# Patient Record
Sex: Male | Born: 1935 | Race: White | Hispanic: No | Marital: Married | Smoking: Former smoker
Health system: Southern US, Community
[De-identification: ages and names within clinical notes are randomized; demographics above are authoritative.]

## PROBLEM LIST (undated history)

## (undated) DIAGNOSIS — I10 Essential (primary) hypertension: Secondary | ICD-10-CM

## (undated) DIAGNOSIS — N289 Disorder of kidney and ureter, unspecified: Secondary | ICD-10-CM

## (undated) DIAGNOSIS — C61 Malignant neoplasm of prostate: Secondary | ICD-10-CM

---

## 2011-08-18 DIAGNOSIS — Z87891 Personal history of nicotine dependence: Secondary | ICD-10-CM | POA: Insufficient documentation

## 2011-08-18 DIAGNOSIS — R339 Retention of urine, unspecified: Secondary | ICD-10-CM | POA: Insufficient documentation

## 2011-08-18 DIAGNOSIS — I10 Essential (primary) hypertension: Secondary | ICD-10-CM | POA: Insufficient documentation

## 2011-08-19 ENCOUNTER — Encounter (HOSPITAL_COMMUNITY): Payer: Self-pay | Admitting: Emergency Medicine

## 2011-08-19 ENCOUNTER — Emergency Department (HOSPITAL_COMMUNITY)
Admission: EM | Admit: 2011-08-19 | Discharge: 2011-08-19 | Disposition: A | Discharge status: Home / Self Care | Payer: Medicare Other | Attending: Emergency Medicine | Admitting: Emergency Medicine

## 2011-08-19 DIAGNOSIS — R339 Retention of urine, unspecified: Secondary | ICD-10-CM

## 2011-08-19 HISTORY — DX: Essential (primary) hypertension: I10

## 2011-08-19 HISTORY — DX: Disorder of kidney and ureter, unspecified: N28.9

## 2011-08-19 LAB — URINALYSIS, MICROSCOPIC ONLY
Bilirubin Urine: NEGATIVE
Glucose, UA: NEGATIVE mg/dL
Ketones, ur: NEGATIVE mg/dL
Nitrite: NEGATIVE
Protein, ur: NEGATIVE mg/dL
Specific Gravity, Urine: 1.015 (ref 1.005–1.030)
Urobilinogen, UA: 0.2 mg/dL (ref 0.0–1.0)
pH: 5.5 (ref 5.0–8.0)

## 2011-08-19 NOTE — ED Notes (Signed)
Pt w/ foley cath off and on since Father's Day. Removed by Dr. Margarita Grizzle yesterday at noon pt unable to void since.

## 2011-08-19 NOTE — ED Provider Notes (Signed)
History     CSN: [id number]  Arrival date & time 08/18/11  2325   First MD Initiated Contact with Patient 08/19/11 0150      Chief Complaint  Patient presents with  . Urinary Retention    cath removed noon 08/18/11, has not voided since    (Consider location/radiation/quality/duration/timing/severity/associated sxs/prior treatment) HPI Hx per PT. abd pressure and unable to urinate since this afternoon. About weeks ago at [facility] had foley placed for new onset urinary retention. Saw DR Margarita Grizzle in the clinic yest afternoon and had foley removed. Had some small amnt of urination atthat time and then again developed retention. No new meds. no F/C. No n/v. progresssively worsening and on arrival symptoms severe.  Past Medical History  Diagnosis Date  . Renal disorder     catheter since 06/26/11  . Hypertension     History reviewed. No pertinent past surgical history.  No family history on file.  History  Substance Use Topics  . Smoking status: Former Games developer  . Smokeless tobacco: Never Used  . Alcohol Use: No      Review of Systems  Constitutional: Negative for fever and chills.  HENT: Negative for neck pain and neck stiffness.   Eyes: Negative for pain.  Respiratory: Negative for shortness of breath.   Cardiovascular: Negative for chest pain.  Gastrointestinal: Negative for abdominal pain.  Genitourinary: Negative for dysuria, hematuria, scrotal swelling, penile pain and testicular pain.  Musculoskeletal: Negative for back pain.  Skin: Negative for rash.  Neurological: Negative for headaches.  All other systems reviewed and are negative.    Allergies  Ampicillin; Erythromycin; and Other  Home Medications   Current Outpatient Rx  Name Route Sig Dispense Refill  . FINASTERIDE 5 MG PO TABS Oral Take 5 mg by mouth daily.    Marland Kitchen LISINOPRIL 20 MG PO TABS Oral Take 20 mg by mouth daily.    Marland Kitchen TAMSULOSIN HCL 0.4 MG PO CAPS Oral Take 0.4 mg by mouth daily.      BP  170/79  Pulse 90  Temp 98.5 F (36.9 C) (Oral)  SpO2 100%  Physical Exam  Constitutional: He is oriented to person, place, and time. He appears well-developed and well-nourished.  HENT:  Head: Normocephalic and atraumatic.  Eyes: Conjunctivae and EOM are normal. Pupils are equal, round, and reactive to light.  Neck: Trachea normal. Neck supple. No thyromegaly present.  Cardiovascular: Normal rate, regular rhythm, S1 normal, S2 normal and normal pulses.     No systolic murmur is present   No diastolic murmur is present  Pulses:      Radial pulses are 2+ on the right side, and 2+ on the left side.  Pulmonary/Chest: Effort normal and breath sounds normal. He has no wheezes. He has no rhonchi. He has no rales. He exhibits no tenderness.  Abdominal: Soft. Normal appearance and bowel sounds are normal. There is no CVA tenderness and negative Murphy's sign.       Suprapubic tenderness and distention. No tenderness or distention o/w  Musculoskeletal:       BLE:s Calves nontender, no cords or erythema, negative Homans sign  Neurological: He is alert and oriented to person, place, and time. He has normal strength. No cranial nerve deficit or sensory deficit. GCS eye subscore is 4. GCS verbal subscore is 5. GCS motor subscore is 6.  Skin: Skin is warm and dry. No rash noted. He is not diaphoretic.  Psychiatric: His speech is normal.  Cooperative and appropriate    ED Course  Procedures (including critical care time)  Foley placed and clear UOP 500cc at 0210 and still flowing.   UA reviewed.   Plan foley leg bag and f/u urology  Results for orders placed during the hospital encounter of 08/19/11  URINALYSIS, WITH MICROSCOPIC      Component Value Range   Color, Urine YELLOW  YELLOW   APPearance CLEAR  CLEAR   Specific Gravity, Urine 1.015  1.005 - 1.030   pH 5.5  5.0 - 8.0   Glucose, UA NEGATIVE  NEGATIVE mg/dL   Hgb urine dipstick SMALL (*) NEGATIVE   Bilirubin Urine NEGATIVE   NEGATIVE   Ketones, ur NEGATIVE  NEGATIVE mg/dL   Protein, ur NEGATIVE  NEGATIVE mg/dL   Urobilinogen, UA 0.2  0.0 - 1.0 mg/dL   Nitrite NEGATIVE  NEGATIVE   Leukocytes, UA SMALL (*) NEGATIVE   WBC, UA 7-10  <3 WBC/hpf   RBC / HPF 3-6  <3 RBC/hpf   UA reviewed - PT on Cipro per Urologist - U Cx pending.   Repeat exam ABd s/nt/nd. Stable for d/c home.   MDM   nuring notes reviewed. Vs reviewed. Old records reviewed. Foley and UA and U cx pending.        Sunnie Nielsen, MD 08/19/11 [phone]

## 2011-08-22 LAB — URINE CULTURE: Colony Count: >=100000

## 2011-08-23 NOTE — ED Notes (Addendum)
+   urine Chart sent to EDP office for review. 

## 2011-08-25 NOTE — ED Notes (Signed)
Chart returned from EDP office . Rx for Macrobid 100 mg tab #14 one tab po BID x 7 days written by Jaynie Crumble needs to be called to pharmacy .

## 2011-08-25 NOTE — ED Notes (Signed)
Patient treated with  cipro by PCP.

## 2014-02-21 ENCOUNTER — Other Ambulatory Visit (HOSPITAL_COMMUNITY): Payer: Self-pay | Admitting: Urology

## 2014-02-21 DIAGNOSIS — C61 Malignant neoplasm of prostate: Secondary | ICD-10-CM

## 2014-03-05 ENCOUNTER — Encounter (HOSPITAL_COMMUNITY)
Admission: RE | Admit: 2014-03-05 | Discharge: 2014-03-05 | Disposition: A | Discharge status: Home / Self Care | Payer: Medicare Other | Source: Ambulatory Visit | Attending: Urology | Admitting: Urology

## 2014-03-05 DIAGNOSIS — C61 Malignant neoplasm of prostate: Secondary | ICD-10-CM | POA: Diagnosis present

## 2014-03-05 MED ORDER — TECHNETIUM TC 99M MEDRONATE IV KIT
26.2000 | PACK | Freq: Once | INTRAVENOUS | Status: AC | PRN
Start: 2014-03-05 — End: 2014-03-05
  Administered 2014-03-05: 26.2 via INTRAVENOUS

## 2014-05-30 ENCOUNTER — Other Ambulatory Visit: Payer: Self-pay | Admitting: Urology

## 2014-05-30 DIAGNOSIS — C61 Malignant neoplasm of prostate: Secondary | ICD-10-CM

## 2014-05-30 DIAGNOSIS — Z79899 Other long term (current) drug therapy: Secondary | ICD-10-CM

## 2014-06-10 ENCOUNTER — Ambulatory Visit
Admission: RE | Admit: 2014-06-10 | Discharge: 2014-06-10 | Disposition: A | Discharge status: Home / Self Care | Payer: Medicare Other | Source: Ambulatory Visit | Attending: Urology | Admitting: Urology

## 2014-06-10 DIAGNOSIS — C61 Malignant neoplasm of prostate: Secondary | ICD-10-CM

## 2014-06-10 DIAGNOSIS — Z79899 Other long term (current) drug therapy: Secondary | ICD-10-CM

## 2015-10-27 ENCOUNTER — Other Ambulatory Visit: Payer: Self-pay | Admitting: Urology

## 2015-10-27 DIAGNOSIS — C61 Malignant neoplasm of prostate: Secondary | ICD-10-CM

## 2015-11-05 ENCOUNTER — Encounter (HOSPITAL_COMMUNITY)
Admission: RE | Admit: 2015-11-05 | Discharge: 2015-11-05 | Disposition: A | Discharge status: Home / Self Care | Payer: Medicare Other | Source: Ambulatory Visit | Attending: Urology | Admitting: Urology

## 2015-11-05 DIAGNOSIS — C61 Malignant neoplasm of prostate: Secondary | ICD-10-CM | POA: Diagnosis present

## 2015-11-05 IMAGING — NM NM BONE WHOLE BODY
2 series · 2 of 2 positions shown · non-contrast
Comparison: Nuclear medicine bone scan [DATE]. CT abdomen and
pelvis [DATE].

CLINICAL DATA: History of prostate cancer. Elevated PSA. No current
osseous complaints.

EXAM:
NUCLEAR MEDICINE WHOLE BODY BONE SCAN
TECHNIQUE: Whole body anterior and posterior images were obtained approximately
3 hours after intravenous injection of radiopharmaceutical.
RADIOPHARMACEUTICALS:  21.4 mCi [HP] MDP IV

[Series 1: whole body · 2.66mm/px · 1 of 1 slices shown (1 of 2)]
[im 1/1]
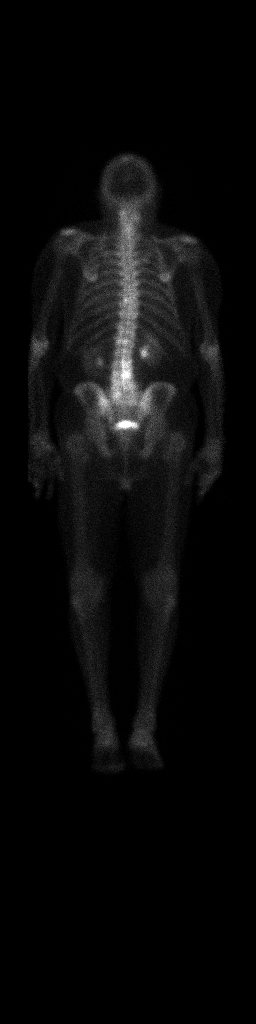

[Series 1: whole body · 2.66mm/px · 1 of 1 slices shown (2 of 2)]
[im 1/1]
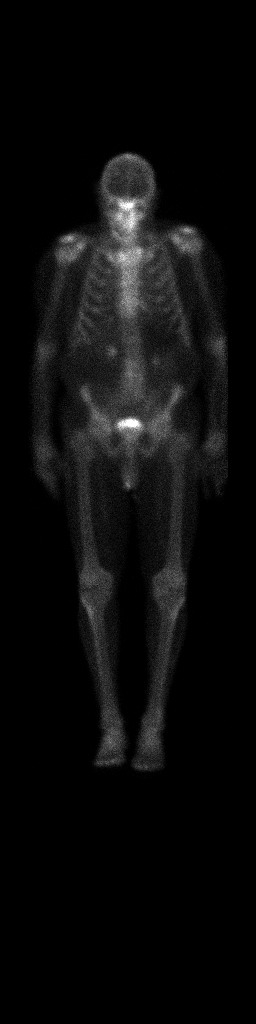

[2 of 2 positions shown; findings below may reference images not displayed]

FINDINGS: Normal radiotracer uptake by the kidneys with excreted tracer in the
bladder. Focally increased tracer uptake in the lower cervical spine
on the prior bone scan is less conspicuous on the current
examination. A punctate focus of slightly increased uptake in the
thoracic spine just left of midline at approximately T8 is
unchanged. Mildly increased uptake to the right of midline in the
mid to lower lumbar spine is unchanged as well. Asymmetric uptake
left of midline at the lumbosacral junction is unchanged. No new
foci of abnormal tracer uptake are seen.
IMPRESSION: 1. Similar appearance of mildly increased uptake scattered
throughout the spine as above, likely degenerative.
2. No new abnormality or definite evidence of osseous metastatic
disease.

## 2015-11-05 MED ORDER — TECHNETIUM TC 99M MEDRONATE IV KIT
21.4000 | PACK | Freq: Once | INTRAVENOUS | Status: AC | PRN
  Administered 2015-11-05: 21.4 via INTRAVENOUS

## 2017-11-07 ENCOUNTER — Other Ambulatory Visit: Payer: Self-pay | Admitting: Urology

## 2017-11-07 DIAGNOSIS — Z192 Hormone resistant malignancy status: Principal | ICD-10-CM

## 2017-11-07 DIAGNOSIS — C61 Malignant neoplasm of prostate: Secondary | ICD-10-CM

## 2017-11-22 ENCOUNTER — Ambulatory Visit (HOSPITAL_COMMUNITY)
Admission: RE | Admit: 2017-11-22 | Discharge: 2017-11-22 | Disposition: A | Discharge status: Home / Self Care | Payer: Medicare Other | Source: Ambulatory Visit | Attending: Urology | Admitting: Urology

## 2017-11-22 DIAGNOSIS — C61 Malignant neoplasm of prostate: Secondary | ICD-10-CM | POA: Diagnosis not present

## 2017-11-22 DIAGNOSIS — Z192 Hormone resistant malignancy status: Secondary | ICD-10-CM | POA: Insufficient documentation

## 2017-11-22 MED ORDER — TECHNETIUM TC 99M MEDRONATE IV KIT
21.8000 | PACK | Freq: Once | INTRAVENOUS | Status: AC | PRN
  Administered 2017-11-22: 21.8 via INTRAVENOUS

## 2018-08-31 ENCOUNTER — Other Ambulatory Visit: Payer: Self-pay | Admitting: Urology

## 2018-08-31 ENCOUNTER — Other Ambulatory Visit (HOSPITAL_COMMUNITY): Payer: Self-pay | Admitting: Urology

## 2018-08-31 DIAGNOSIS — C61 Malignant neoplasm of prostate: Secondary | ICD-10-CM

## 2018-09-05 ENCOUNTER — Ambulatory Visit (HOSPITAL_COMMUNITY): Admission: RE | Admit: 2018-09-05 | Payer: Medicare Other | Source: Ambulatory Visit

## 2018-09-05 ENCOUNTER — Other Ambulatory Visit (HOSPITAL_COMMUNITY): Payer: Self-pay | Admitting: Urology

## 2018-09-05 ENCOUNTER — Encounter (HOSPITAL_COMMUNITY): Payer: Self-pay

## 2018-09-05 DIAGNOSIS — C775 Secondary and unspecified malignant neoplasm of intrapelvic lymph nodes: Secondary | ICD-10-CM

## 2018-09-13 ENCOUNTER — Encounter (HOSPITAL_COMMUNITY)
Admission: RE | Admit: 2018-09-13 | Discharge: 2018-09-13 | Disposition: A | Discharge status: Home / Self Care | Payer: Medicare Other | Source: Ambulatory Visit | Attending: Urology | Admitting: Urology

## 2018-09-13 ENCOUNTER — Other Ambulatory Visit: Payer: Self-pay

## 2018-09-13 DIAGNOSIS — C775 Secondary and unspecified malignant neoplasm of intrapelvic lymph nodes: Secondary | ICD-10-CM | POA: Diagnosis not present

## 2018-09-13 IMAGING — NM NM BONE WHOLE BODY
2 series · 2 of 2 positions shown · non-contrast
Comparison: [DATE]

Correlation: CT chest abdomen pelvis [DATE]

CLINICAL DATA: Metastatic prostate cancer

EXAM:
NUCLEAR MEDICINE WHOLE BODY BONE SCAN
TECHNIQUE: Whole body anterior and posterior images were obtained approximately
3 hours after intravenous injection of radiopharmaceutical.
RADIOPHARMACEUTICALS:  19.6 mCi [XA] MDP IV

[Series 1: whole body · 2.66mm/px · 1 of 1 slices shown (1 of 2)]
[im 1/1]
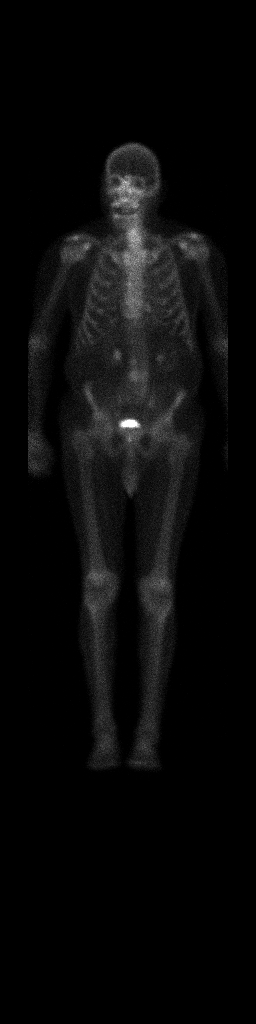

[Series 1: whole body · 2.66mm/px · 1 of 1 slices shown (2 of 2)]
[im 1/1]
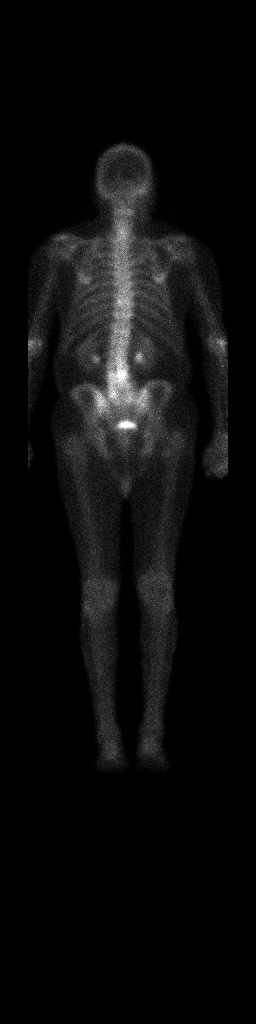

[2 of 2 positions shown; findings below may reference images not displayed]

FINDINGS: Uptake at the shoulders, typically degenerative.

Mild biconvex thoracolumbar scoliosis with uptake at the concave
RIGHT lateral aspect of the lower lumbar spine and at the LEFT mid
to lower thoracic spine.

The sites are unchanged since the previous exam, likely degenerative
and corresponding to degenerative changes identified on CT.

No additional worrisome sites of abnormal osseous tracer
accumulation are identified to suggest osseous metastatic disease.

Expected urinary tract and soft tissue distribution of tracer.
IMPRESSION: No scintigraphic evidence of osseous metastatic disease.

## 2018-09-13 MED ORDER — TECHNETIUM TC 99M MEDRONATE IV KIT
19.6000 | PACK | Freq: Once | INTRAVENOUS | Status: AC
  Administered 2018-09-13: 13:00:00 19.6 via INTRAVENOUS

## 2019-03-21 ENCOUNTER — Ambulatory Visit: Payer: Medicare Other | Attending: Internal Medicine

## 2019-03-21 DIAGNOSIS — Z23 Encounter for immunization: Secondary | ICD-10-CM

## 2019-03-21 NOTE — Progress Notes (Signed)
   Covid-19 Vaccination Clinic  Name:  Martin Foster    MRN: [id number] DOB: 10-Dec-1925  03/21/2019  [name] was observed post Covid-19 immunization for 15 minutes without incident. He was provided with Vaccine Information Sheet and instruction to access the V-Safe system.   [name] was instructed to call 911 with any severe reactions post vaccine: Marland Kitchen Difficulty breathing  . Swelling of face and throat  . A fast heartbeat  . A bad rash all over body  . Dizziness and weakness   Immunizations Administered    Name Date Dose VIS Date Route   Pfizer COVID-19 Vaccine 03/21/2019  3:40 PM 0.3 mL 12/21/2018 Intramuscular   Manufacturer: [place]   Lot: [id number]   [place]: [id number]

## 2019-04-15 ENCOUNTER — Ambulatory Visit: Payer: Medicare Other | Attending: Internal Medicine

## 2019-04-15 DIAGNOSIS — Z23 Encounter for immunization: Secondary | ICD-10-CM

## 2019-04-15 NOTE — Progress Notes (Signed)
   Covid-19 Vaccination Clinic  Name:  Martin Foster    MRN: [id number] DOB: June 17, 1925  04/15/2019  Mr. [name] was observed post Covid-19 immunization for 15 minutes without incident. He was provided with Vaccine Information Sheet and instruction to access the V-Safe system.   Mr. [name] was instructed to call 911 with any severe reactions post vaccine: Marland Kitchen Difficulty breathing  . Swelling of face and throat  . A fast heartbeat  . A bad rash all over body  . Dizziness and weakness   Immunizations Administered    Name Date Dose VIS Date Route   Pfizer COVID-19 Vaccine 04/15/2019  4:13 PM 0.3 mL 12/21/2018 Intramuscular   Manufacturer: [employer]   Lot: [id number]   [place]: [id number]

## 2019-11-11 DIAGNOSIS — K3580 Unspecified acute appendicitis: Secondary | ICD-10-CM | POA: Insufficient documentation

## 2019-11-11 DIAGNOSIS — K6289 Other specified diseases of anus and rectum: Secondary | ICD-10-CM | POA: Insufficient documentation

## 2019-11-11 DIAGNOSIS — I700 Atherosclerosis of aorta: Secondary | ICD-10-CM | POA: Insufficient documentation

## 2019-11-11 HISTORY — PX: APPENDECTOMY: SHX54

## 2019-11-13 DIAGNOSIS — I10 Essential (primary) hypertension: Secondary | ICD-10-CM | POA: Insufficient documentation

## 2020-01-09 DIAGNOSIS — C20 Malignant neoplasm of rectum: Secondary | ICD-10-CM

## 2020-01-09 HISTORY — DX: Malignant neoplasm of rectum: C20

## 2020-01-14 DIAGNOSIS — D128 Benign neoplasm of rectum: Secondary | ICD-10-CM | POA: Diagnosis not present

## 2020-02-10 DIAGNOSIS — R339 Retention of urine, unspecified: Secondary | ICD-10-CM | POA: Diagnosis not present

## 2020-02-10 DIAGNOSIS — Z880 Allergy status to penicillin: Secondary | ICD-10-CM | POA: Diagnosis not present

## 2020-02-10 DIAGNOSIS — Z79899 Other long term (current) drug therapy: Secondary | ICD-10-CM | POA: Diagnosis not present

## 2020-02-10 DIAGNOSIS — N390 Urinary tract infection, site not specified: Secondary | ICD-10-CM | POA: Diagnosis not present

## 2020-02-10 DIAGNOSIS — T83038A Leakage of other indwelling urethral catheter, initial encounter: Secondary | ICD-10-CM | POA: Diagnosis not present

## 2020-02-10 DIAGNOSIS — Z881 Allergy status to other antibiotic agents status: Secondary | ICD-10-CM | POA: Diagnosis not present

## 2020-02-10 DIAGNOSIS — I10 Essential (primary) hypertension: Secondary | ICD-10-CM | POA: Diagnosis not present

## 2020-02-18 DIAGNOSIS — C20 Malignant neoplasm of rectum: Secondary | ICD-10-CM | POA: Diagnosis not present

## 2020-02-19 ENCOUNTER — Other Ambulatory Visit: Payer: Self-pay | Admitting: General Surgery

## 2020-02-19 DIAGNOSIS — C20 Malignant neoplasm of rectum: Secondary | ICD-10-CM

## 2020-02-21 ENCOUNTER — Other Ambulatory Visit: Payer: Self-pay

## 2020-02-21 ENCOUNTER — Ambulatory Visit
Admission: RE | Admit: 2020-02-21 | Discharge: 2020-02-21 | Disposition: A | Discharge status: Home / Self Care | Payer: Medicare Other | Source: Ambulatory Visit | Attending: General Surgery | Admitting: General Surgery

## 2020-02-21 DIAGNOSIS — C20 Malignant neoplasm of rectum: Secondary | ICD-10-CM | POA: Diagnosis not present

## 2020-02-21 DIAGNOSIS — K6289 Other specified diseases of anus and rectum: Secondary | ICD-10-CM | POA: Diagnosis not present

## 2020-02-21 DIAGNOSIS — D492 Neoplasm of unspecified behavior of bone, soft tissue, and skin: Secondary | ICD-10-CM | POA: Diagnosis not present

## 2020-02-21 IMAGING — MR MR PELVIS W/O CM
6 of 7 series · 37 of 48 positions shown · non-contrast
Comparison: None

CLINICAL DATA: Rectal cancer, by report the patient has an
abnormality that was identified on a prior CT evaluation for which
no images are available.

EXAM:
MRI PELVIS WITHOUT CONTRAST
TECHNIQUE: Multiplanar multisequence MR imaging of the pelvis was performed. No
intravenous contrast was administered. Small amount of US gel was
administered per rectum to optimize tumor evaluation.

[Series 2: t2_tse_sag · sagittal · 3.0mm · 0.47mm/px · 5 of 37 slices shown]
[im 1/37]
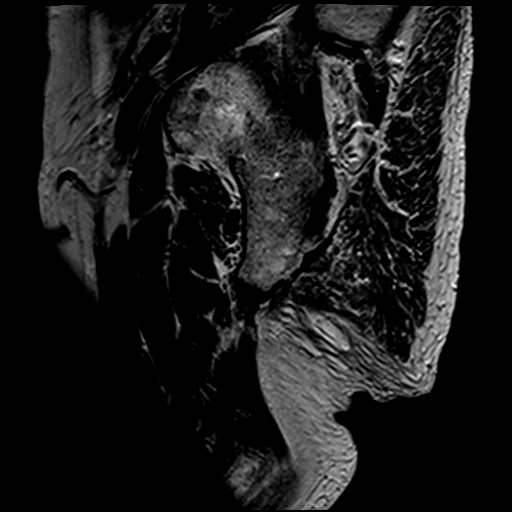
[im 10/37]
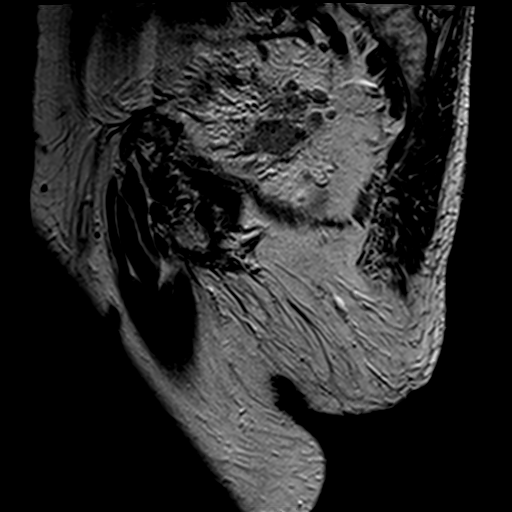
[im 19/37]
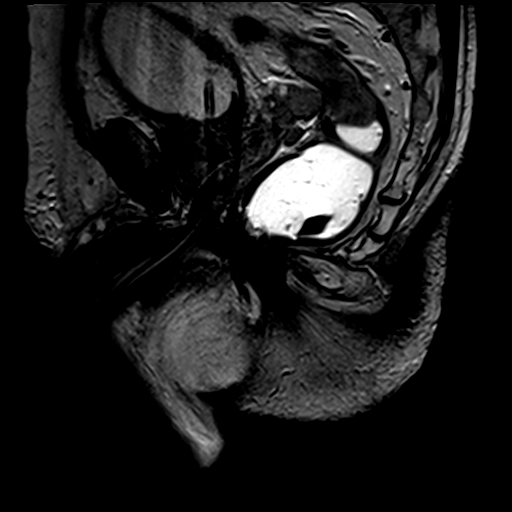
[im 28/37]
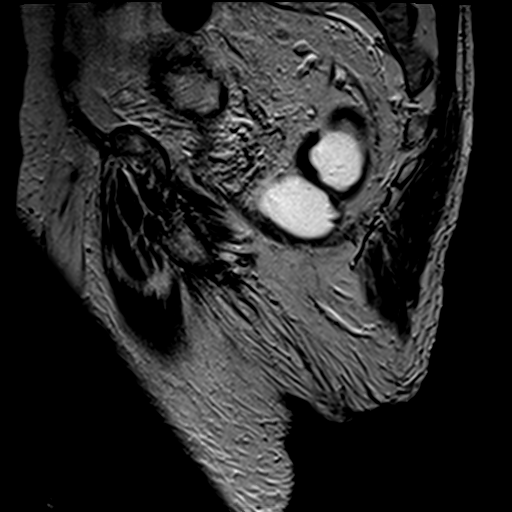
[im 37/37]
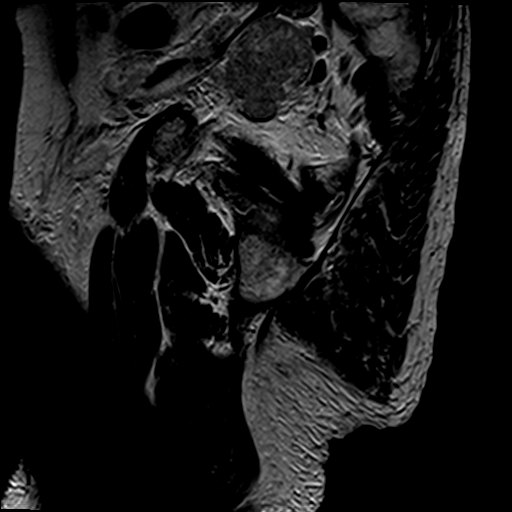

[Series 3: ep2d_diff_b0_100_500 · axial · 5.0mm · 1.82mm/px · z∈[-91,+107]mm · 8 of 102 slices shown]
[im 1/102]
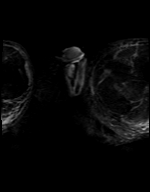
[im 16/102]
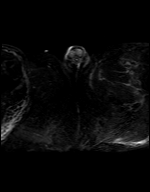
[im 32/102]
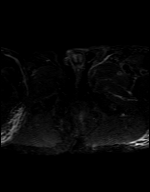
[im 47/102]
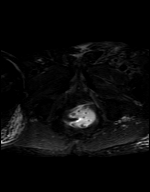
[im 55/102]
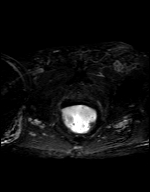
[im 70/102]
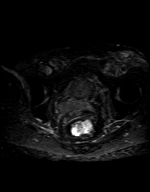
[im 86/102]
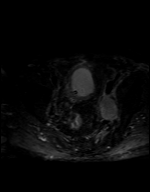
[im 102/102]
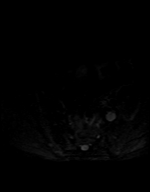

[Series 4: ep2d_diff_b0_100_500_adc · axial · 5.0mm · 1.82mm/px · z∈[-91,+107]mm · 5 of 34 slices shown]
[im 1/34]
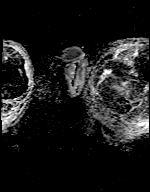
[im 9/34]
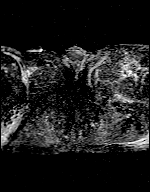
[im 17/34]
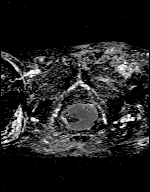
[im 25/34]
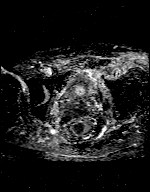
[im 34/34]
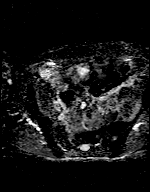

[Series 5: T2 fat-sat · axial · 5.0mm · 1.19mm/px · z∈[-91,+113]mm · 5 of 35 slices shown]
[im 1/35]
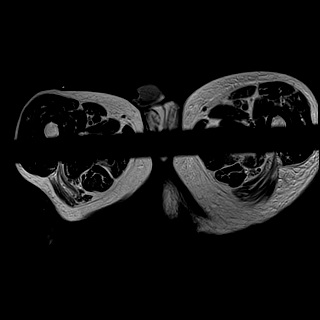
[im 9/35]
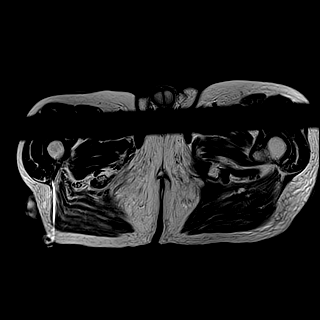
[im 18/35]
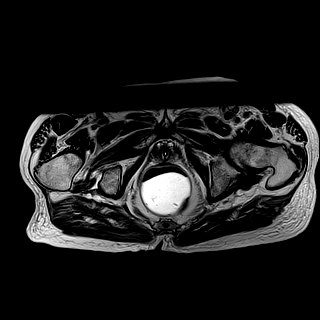
[im 26/35]
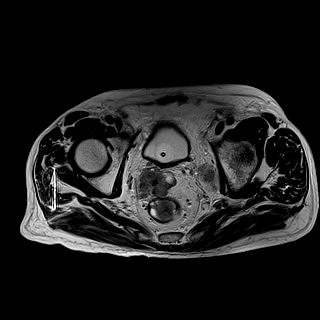
[im 35/35]
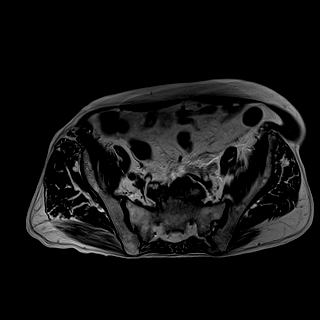

[Series 9: T2 · coronal · 3.0mm · 0.35mm/px · 6 of 45 slices shown (1 of 2)]
[im 1/45]
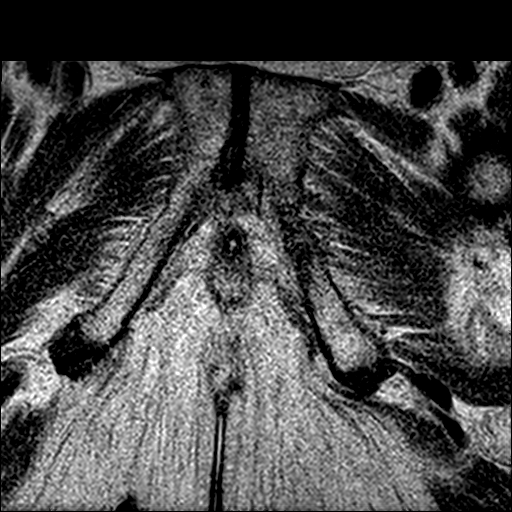
[im 9/45]
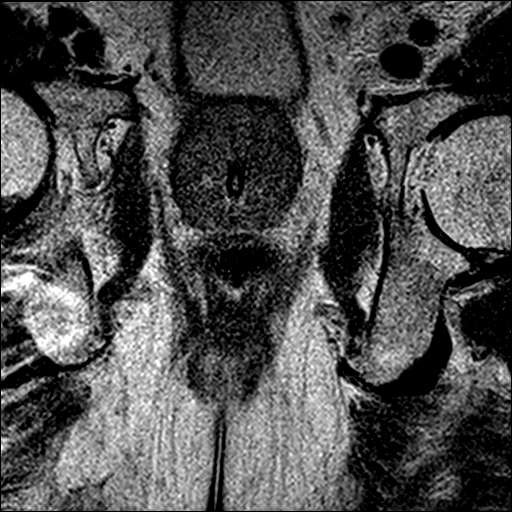
[im 18/45]
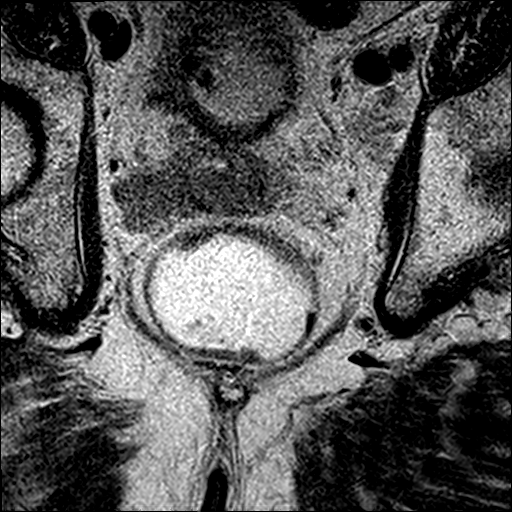
[im 27/45]
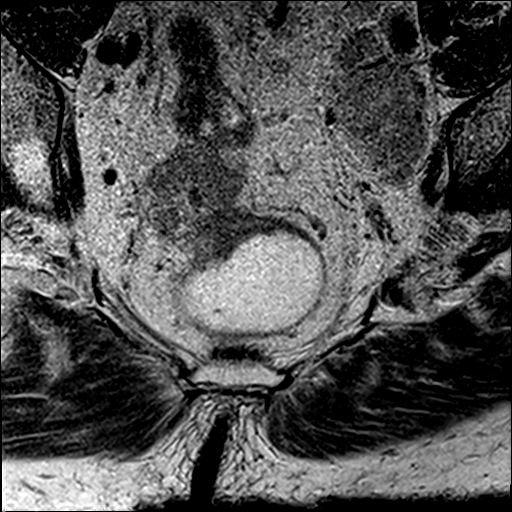
[im 36/45]
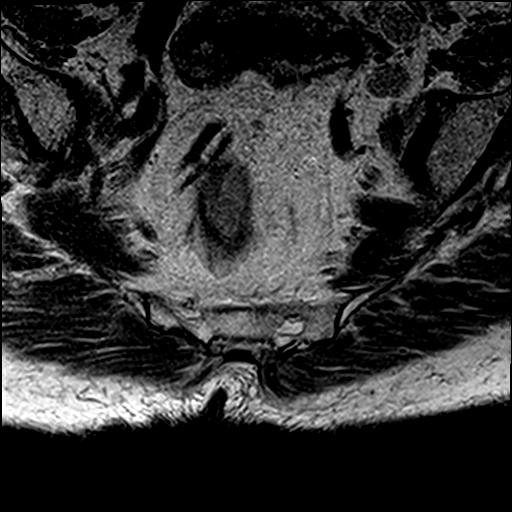
[im 45/45]
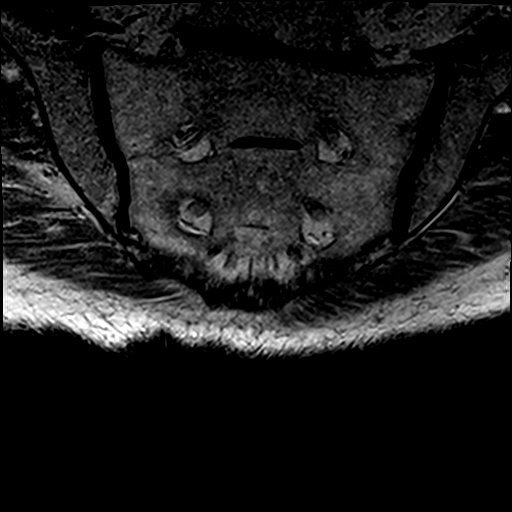

[Series 10: T2 · axial · 3.0mm · 0.70mm/px · z∈[-68,+62]mm · 8 of 55 slices shown (2 of 2)]
[im 1/55]
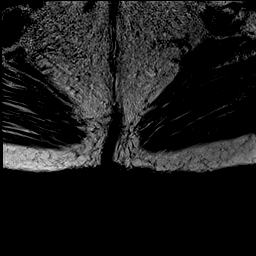
[im 8/55]
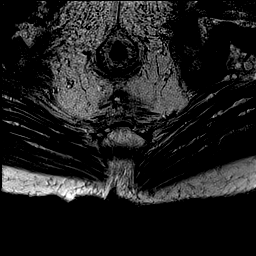
[im 16/55]
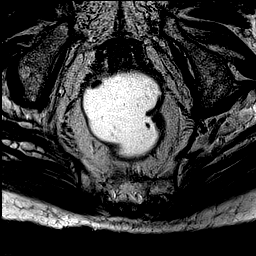
[im 24/55]
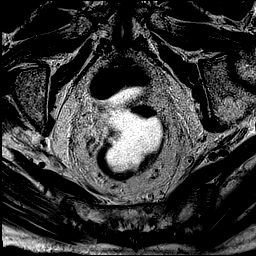
[im 31/55]
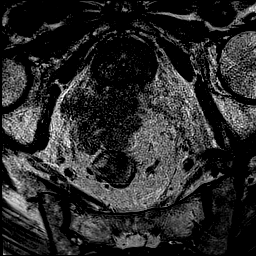
[im 39/55]
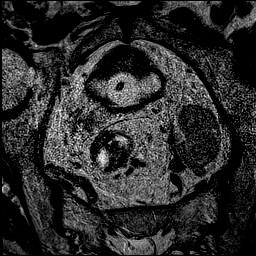
[im 47/55]
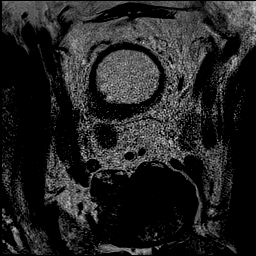
[im 55/55]
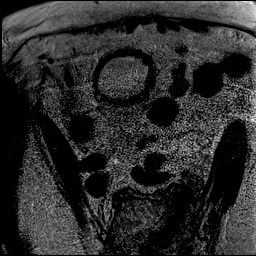

[37 of 48 positions shown; findings below may reference images not displayed]

FINDINGS: TUMOR LOCATION

Tumor distance from Anal Verge/Skin Surface:  Approximately 10.5 cm

Tumor distance to Internal Anal Sphincter: 6.7 cm

TUMOR DESCRIPTION

Circumferential Extent:

cm

T - CATEGORY

Extension through Muscularis Propria: Yes with large mass contiguous
with the anterior rectum tracking along the anterior peritoneal
reflection and extending into the seminal vesicles seen on sagittal
image 18 of series 2. Site of extension also seen on axial image 23
of series 10 at the level of the anterior peritoneal reflection
where tumor extends beyond the wall of the rectum.

Shortest Distance of any tumor/node from Mesorectal Fascia: Involved
and with involvement of the anterior peritoneal reflection.

Extramural Vascular Invasion/Tumor Thrombus: Strongly suggested
though due to extensive local disease in the pelvis this cannot be
confirmed.

Invasion of Anterior Peritoneal Reflection: Yes

Tumor mass extending along the APR best seen on image 26 of series
10 and 18 of series 2 measuring approximately 5.2 x 4.9 cm greatest
axial dimension and extending into the posterior prostate and
involving the seminal vesicles replacement of the RIGHT seminal
vesicle with tumor. LEFT seminal vesicle may be atrophic secondary
to tumoral involvement.

Involvement of Adjacent Organs or Pelvic Sidewall: Yes with
involvement of prostate and seminal vesicles and abutting the
posterior margin of the urinary bladder

Levator Ani Involvement: No

N - CATEGORY

Mesorectal Lymph Nodes >=5mm: Multiple lymph nodes throughout the
pelvis with large mass along the anterior peritoneal reflection.
Nodes track along the superior rectal vein, multiple nodes.

Extra-mesorectal Lymphadenopathy: Yes

(Image 18, series 10) LEFT pelvic sidewall lymph node 4.4 x 2.8 cm.

(Image 13, series 10) 2 cm lymph node along the internal iliac chain
with enlarged LEFT common iliac nodes partially visualized.

Some enlargement of inguinal lymph nodes also demonstrated as well
as external iliac lymph nodes seen on diffusion.

Other:  None.
IMPRESSION: Locally advanced rectal cancer with involvement of prostate, seminal
vesicles and APR with extensive nodal disease that extends into the
retroperitoneum. Pattern of disease is high risk for distant disease
based on appearance of iliac nodes on the LEFT and the extensive
involvement in the inferior peritoneal recess/APR. Scanning of the
abdomen and pelvis and potentially even the chest is suggested if
not yet performed.

## 2020-03-05 ENCOUNTER — Ambulatory Visit
Admission: RE | Admit: 2020-03-05 | Discharge: 2020-03-05 | Disposition: A | Discharge status: Home / Self Care | Payer: Medicare Other | Source: Ambulatory Visit | Attending: General Surgery | Admitting: General Surgery

## 2020-03-05 DIAGNOSIS — J8410 Pulmonary fibrosis, unspecified: Secondary | ICD-10-CM | POA: Diagnosis not present

## 2020-03-05 DIAGNOSIS — Z85048 Personal history of other malignant neoplasm of rectum, rectosigmoid junction, and anus: Secondary | ICD-10-CM | POA: Diagnosis not present

## 2020-03-05 DIAGNOSIS — I2510 Atherosclerotic heart disease of native coronary artery without angina pectoris: Secondary | ICD-10-CM | POA: Diagnosis not present

## 2020-03-05 DIAGNOSIS — K7689 Other specified diseases of liver: Secondary | ICD-10-CM | POA: Diagnosis not present

## 2020-03-05 DIAGNOSIS — D7389 Other diseases of spleen: Secondary | ICD-10-CM | POA: Diagnosis not present

## 2020-03-05 DIAGNOSIS — C20 Malignant neoplasm of rectum: Secondary | ICD-10-CM

## 2020-03-05 DIAGNOSIS — I708 Atherosclerosis of other arteries: Secondary | ICD-10-CM | POA: Diagnosis not present

## 2020-03-05 IMAGING — CT CT CHEST W/ CM
2 of 3 series · 11 of 32 positions shown, 15 images · IV contrast (APPLIED)
Comparison: No priors.

CLINICAL DATA: [AGE] male with history of rectal cancer.

EXAM:
CT CHEST, ABDOMEN, AND PELVIS WITH CONTRAST
TECHNIQUE: Multidetector CT imaging of the chest, abdomen and pelvis was
performed following the standard protocol during bolus
administration of intravenous contrast.
CONTRAST:  100mL [09] IOPAMIDOL ([09]) INJECTION 61%

[Series 2: chest/abd/pelvis w/cm · axial · 0.81mm/px · z∈[-572,-57]mm · 8 of 127 slices shown]
[im 12/127  soft-tissue]
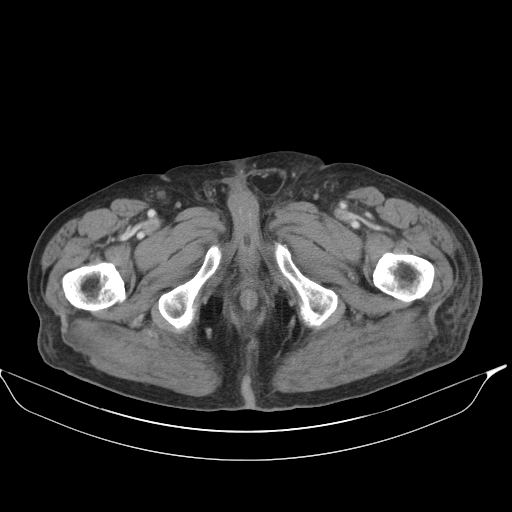
[im 23/127  soft-tissue]
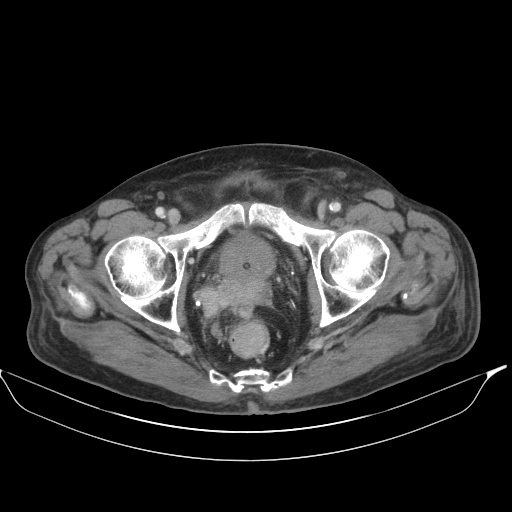
[im 46/127  soft-tissue]
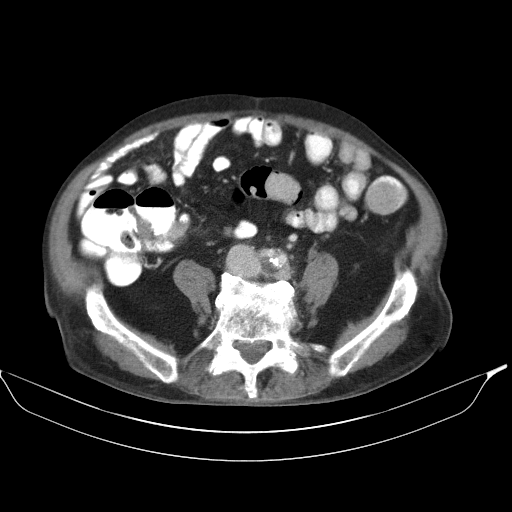
[im 58/127  soft-tissue]
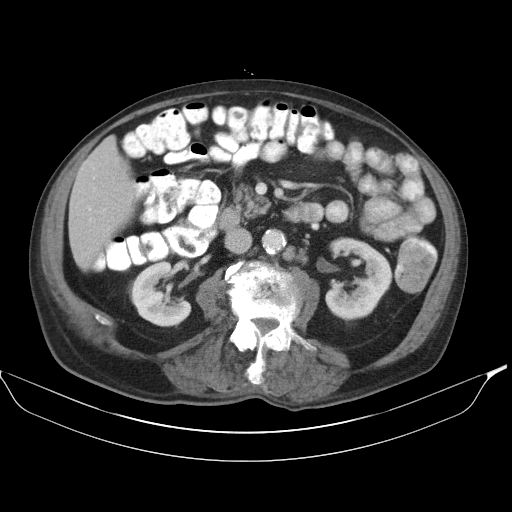
[im 69/127  soft-tissue]
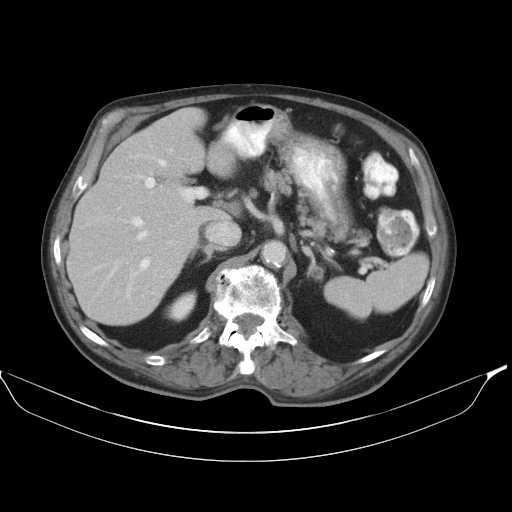
[im 81/127  soft-tissue]
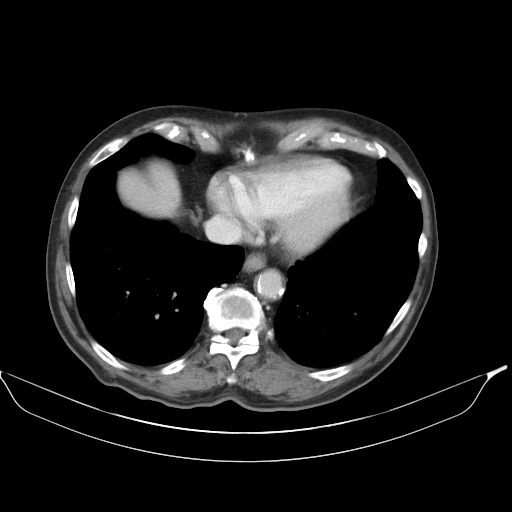
[im 104/127  soft-tissue]
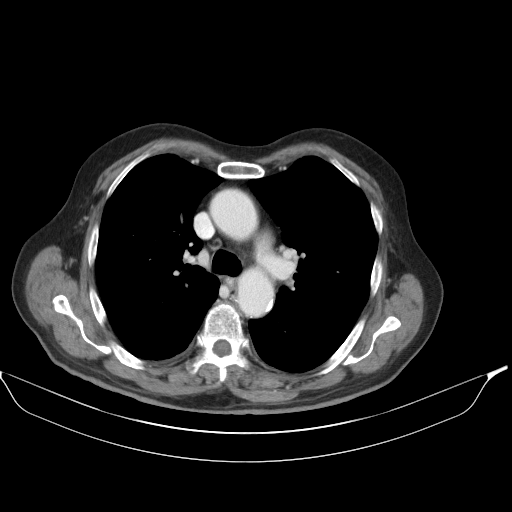
[im 115/127  soft-tissue]
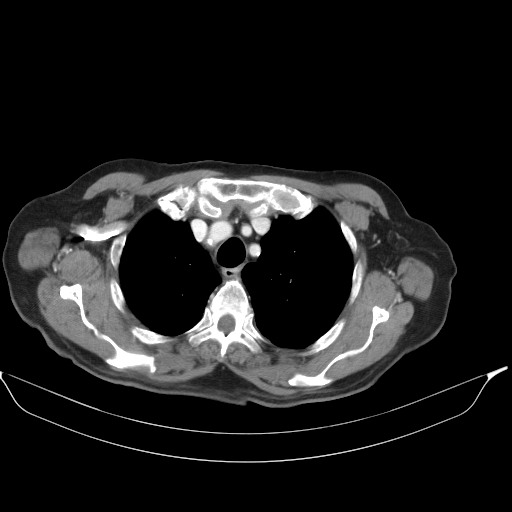

[Series 7: renal delay · axial · delayed · 0.81mm/px · z∈[-406,-266]mm · 3 of 29 slices shown, 7 images]
[im 1/29  soft-tissue]
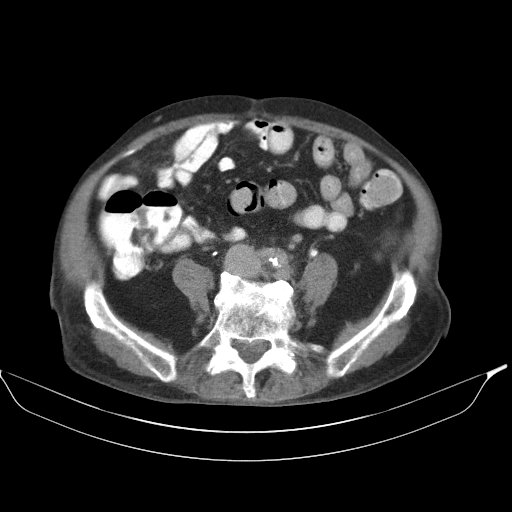
[im 1/29  lung]
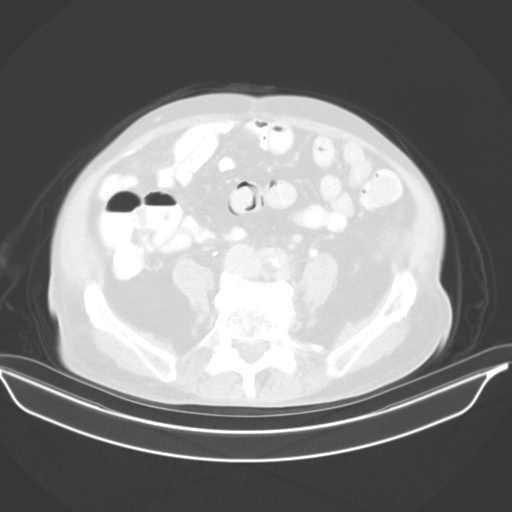
[im 1/29  bone]
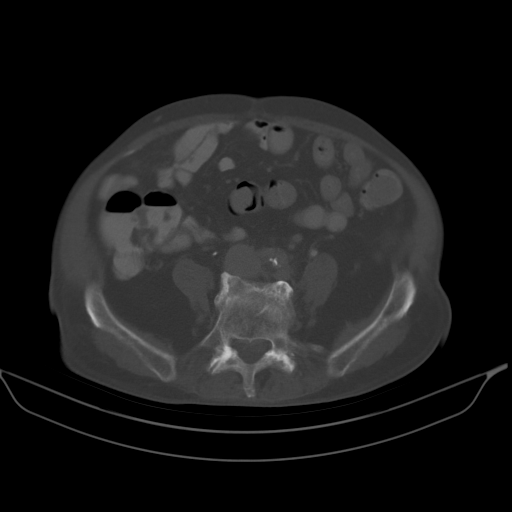
[im 15/29  soft-tissue]
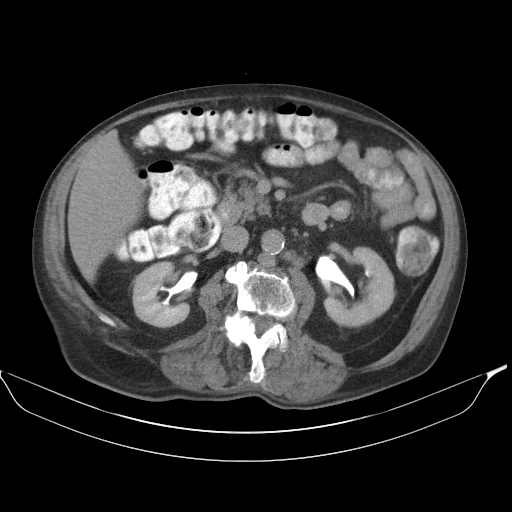
[im 15/29  lung]
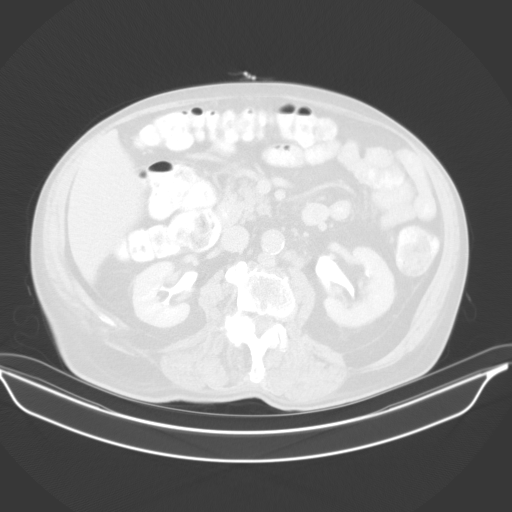
[im 29/29  soft-tissue]
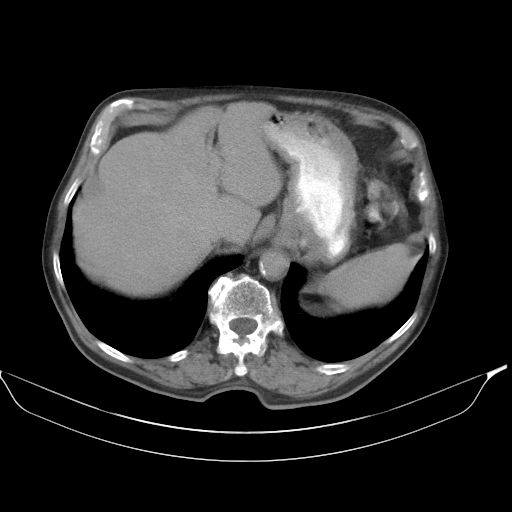
[im 29/29  lung]
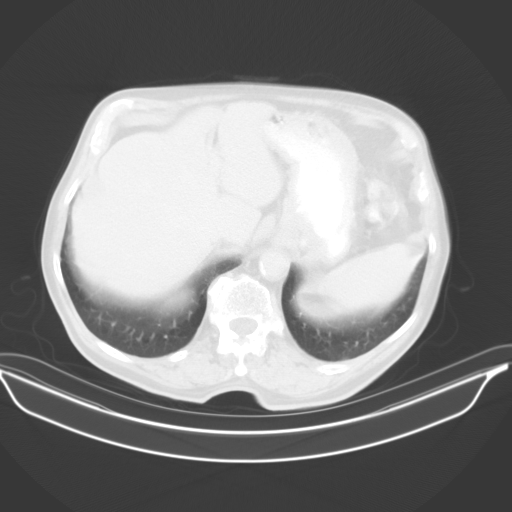

[11 of 32 positions shown; findings below may reference images not displayed]

FINDINGS: CT CHEST FINDINGS

Cardiovascular: Heart size is normal. There is no significant
pericardial fluid, thickening or pericardial calcification. There is
aortic atherosclerosis, as well as atherosclerosis of the great
vessels of the mediastinum and the coronary arteries, including
calcified atherosclerotic plaque in the left anterior descending,
left circumflex and right coronary arteries. Thickening and
calcification of the aortic valve.

Mediastinum/Nodes: No pathologically enlarged mediastinal or hilar
lymph nodes. Esophagus is unremarkable in appearance. No axillary
lymphadenopathy.

Lungs/Pleura: Tiny calcified granulomas in the left upper lobe. No
other suspicious appearing pulmonary nodules or masses are noted. No
acute consolidative airspace disease. No pleural effusions.

Musculoskeletal: There are no aggressive appearing lytic or blastic
lesions noted in the visualized portions of the skeleton.

CT ABDOMEN PELVIS FINDINGS

Hepatobiliary: Subcentimeter low-attenuation lesion in segment 3 of
the liver, too small to characterize, but statistically likely to
represent a tiny cyst. Area of hypoenhancement in the medial aspect
of segment 4B of the liver adjacent to the falciform ligament, most
characteristic for a benign perfusion anomaly. No other definite
suspicious cystic or solid hepatic lesions. No intra or extrahepatic
biliary ductal dilatation. Gallbladder is nearly decompressed, but
otherwise unremarkable in appearance.

Pancreas: No pancreatic mass. No pancreatic ductal dilatation. No
pancreatic or peripancreatic fluid collections or inflammatory
changes.

Spleen: Multiple small calcified granulomas scattered throughout the
spleen.

Adrenals/Urinary Tract: Bilateral kidneys and adrenal glands are
normal in appearance. No hydroureteronephrosis. Foley balloon
catheter present within the lumen of the urinary bladder. Small
amount of gas non dependently in the lumen of the urinary bladder is
iatrogenic. Mild thickening of the posterior wall of the urinary
bladder adjacent to the tip of the Foley catheter where there is
urothelial hyperenhancement, favored to be reactive to the
indwelling catheter.

Stomach/Bowel: Normal appearance of the stomach. No pathologic
dilatation of small bowel or colon. Focal area of nodular soft
tissue thickening in the mid rectal wall (axial image 104 of series
2 and coronal image 113 of series 3), suspicious for primary rectal
neoplasm, estimated to measure approximately 3.2 x 2.8 x 3.9 cm.
Adjacent to this there is extensive abnormal enhancing soft tissue
in the mesorectal fat extending into the right seminal vesicle. This
soft tissue is highly irregular in shape and therefore difficult to
accurately measure, but is estimated to measure approximately 4.2 x
6.5 x 3.9 cm (axial image 103 of series 2 and coronal image 95 of
series 3). The appendix is not confidently identified and may be
surgically absent. Regardless, there are no inflammatory changes
noted adjacent to the cecum to suggest the presence of an acute
appendicitis at this time.

Vascular/Lymphatic: Aortic atherosclerosis, without evidence of
aneurysm or dissection in the abdominal or pelvic vasculature. There
is extensive enhancing lymphadenopathy noted throughout the pelvis
and retroperitoneum. The largest retroperitoneal lymph nodes measure
up to 1.5 cm in short axis in the left para-aortic nodal station
(axial image 72 of series 2). The largest pelvic lymph node is along
the left pelvic sidewall in the internal iliac nodal chain measuring
up to 2.6 cm in short axis (axial image 97 of series 2). Multiple
other smaller borderline enlarged and enlarged lymph nodes are noted
elsewhere in the pelvis and retroperitoneum.

Reproductive: Right seminal vesicle appears diffusely infiltrated by
abnormal enhancing soft tissue (discussed above). Prostate gland and
left seminal vesicle are grossly unremarkable in appearance.

Other: No significant volume of ascites.  No pneumoperitoneum.

Musculoskeletal: There are no aggressive appearing lytic or blastic
lesions noted in the visualized portions of the skeleton.
IMPRESSION: 1. Findings are concerning for primary rectal neoplasm with
metastatic disease involving the mesorectal lymph nodes which appear
contiguous with enhancing soft tissue in the right seminal vesicle,
as well as extensive pelvic and retroperitoneal lymphadenopathy, as
detailed above. No signs of metastatic disease in the thorax.
2. Aortic atherosclerosis, in addition to 3 vessel coronary artery
disease.
3. There are calcifications of the aortic valve. Echocardiographic
correlation for evaluation of potential valvular dysfunction may be
warranted if clinically indicated.
4. Additional incidental findings, as above.

## 2020-03-05 IMAGING — CT CT ABD-PELV W/ CM
2 of 3 series · 11 of 32 positions shown, 15 images · IV contrast (APPLIED)
Comparison: No priors.

CLINICAL DATA: [AGE] male with history of rectal cancer.

EXAM:
CT CHEST, ABDOMEN, AND PELVIS WITH CONTRAST
TECHNIQUE: Multidetector CT imaging of the chest, abdomen and pelvis was
performed following the standard protocol during bolus
administration of intravenous contrast.
CONTRAST:  100mL [09] IOPAMIDOL ([09]) INJECTION 61%

[Series 2: chest/abd/pelvis w/cm · axial · 0.81mm/px · z∈[-572,-57]mm · 8 of 127 slices shown]
[im 12/127  soft-tissue]
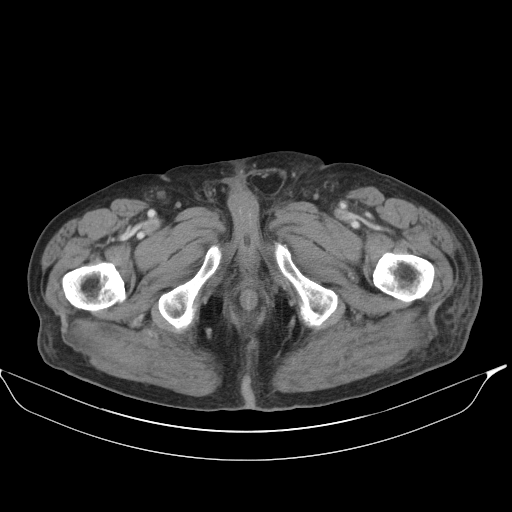
[im 23/127  soft-tissue]
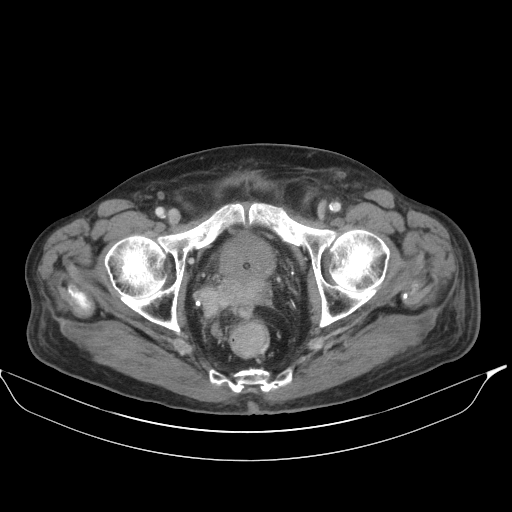
[im 46/127  soft-tissue]
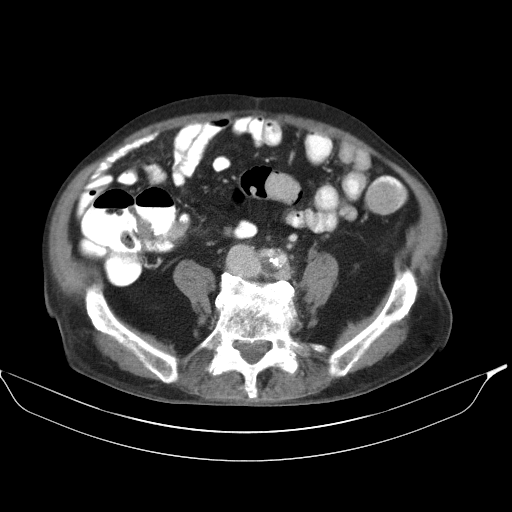
[im 58/127  soft-tissue]
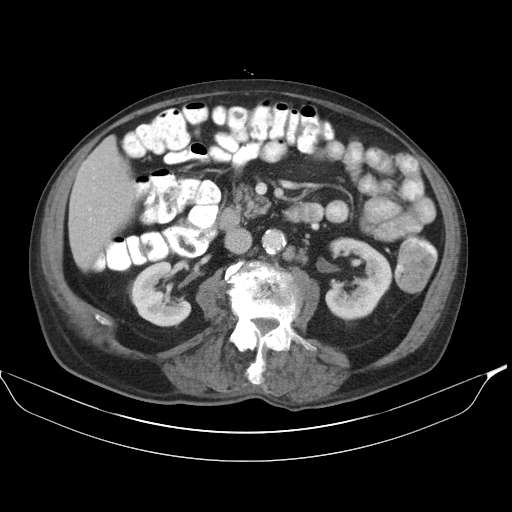
[im 69/127  soft-tissue]
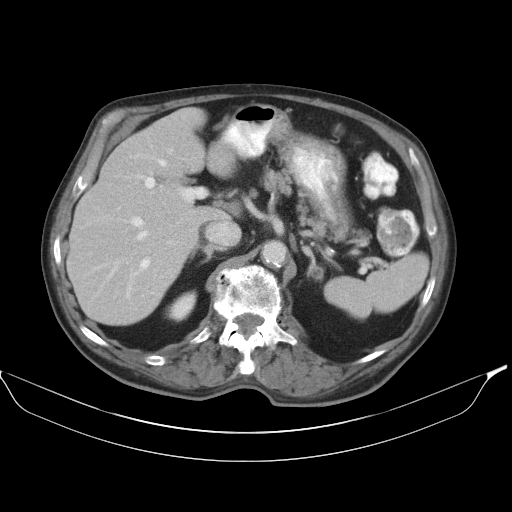
[im 81/127  soft-tissue]
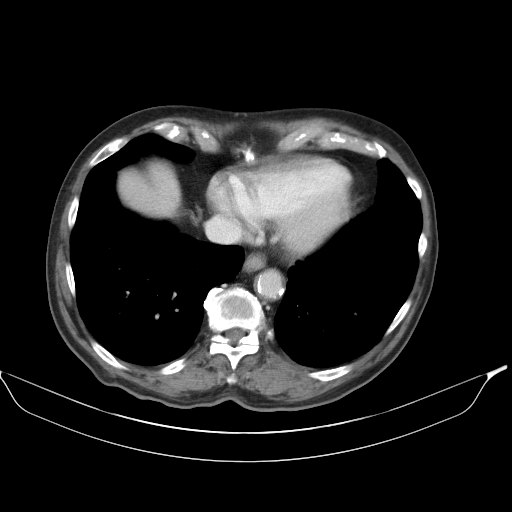
[im 104/127  soft-tissue]
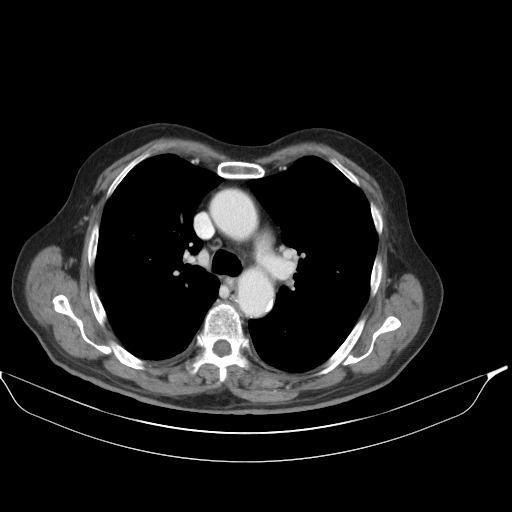
[im 115/127  soft-tissue]
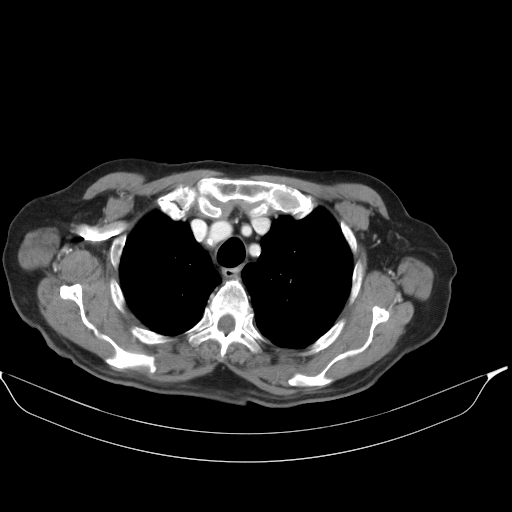

[Series 7: renal delay · axial · delayed · 0.81mm/px · z∈[-406,-266]mm · 3 of 29 slices shown, 7 images]
[im 1/29  soft-tissue]
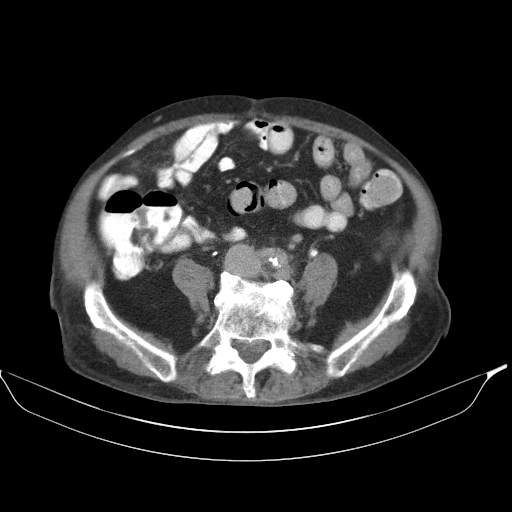
[im 1/29  lung]
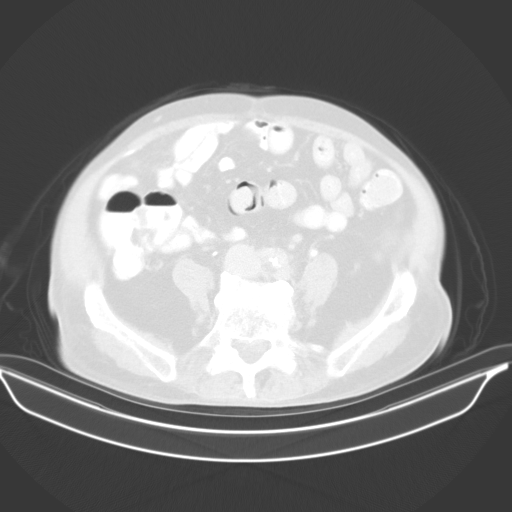
[im 1/29  bone]
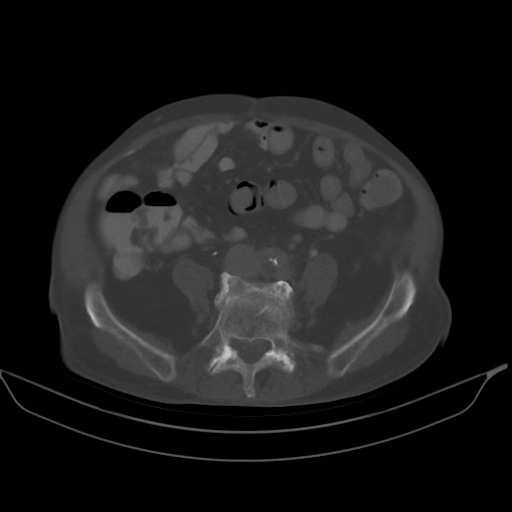
[im 15/29  soft-tissue]
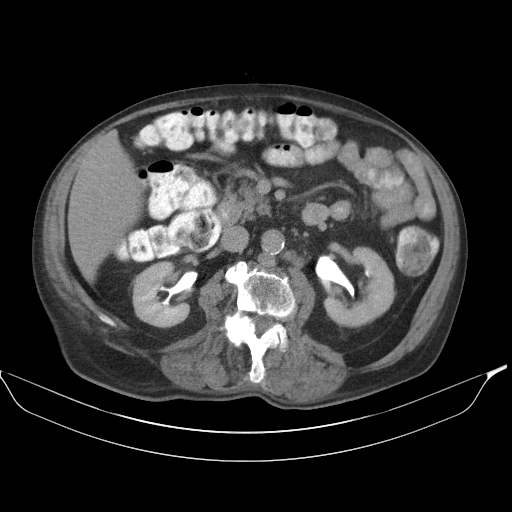
[im 15/29  lung]
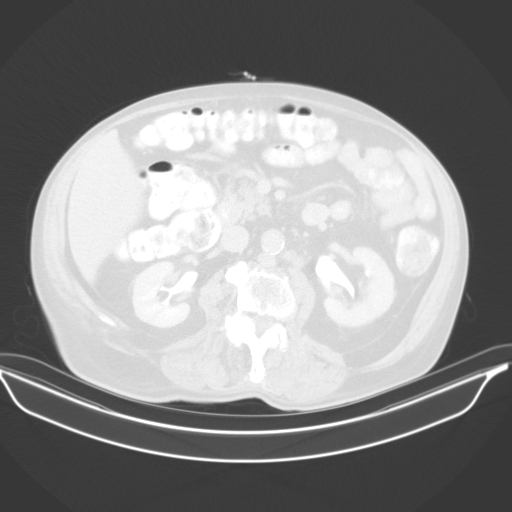
[im 29/29  soft-tissue]
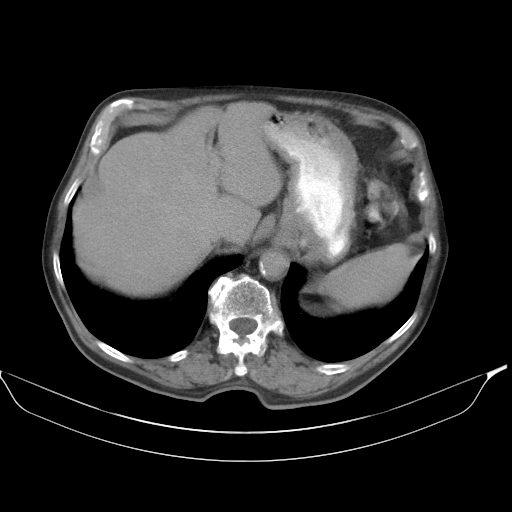
[im 29/29  lung]
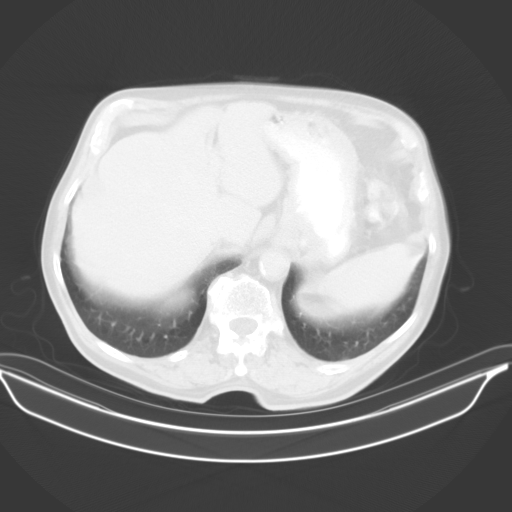

[11 of 32 positions shown; findings below may reference images not displayed]

FINDINGS: CT CHEST FINDINGS

Cardiovascular: Heart size is normal. There is no significant
pericardial fluid, thickening or pericardial calcification. There is
aortic atherosclerosis, as well as atherosclerosis of the great
vessels of the mediastinum and the coronary arteries, including
calcified atherosclerotic plaque in the left anterior descending,
left circumflex and right coronary arteries. Thickening and
calcification of the aortic valve.

Mediastinum/Nodes: No pathologically enlarged mediastinal or hilar
lymph nodes. Esophagus is unremarkable in appearance. No axillary
lymphadenopathy.

Lungs/Pleura: Tiny calcified granulomas in the left upper lobe. No
other suspicious appearing pulmonary nodules or masses are noted. No
acute consolidative airspace disease. No pleural effusions.

Musculoskeletal: There are no aggressive appearing lytic or blastic
lesions noted in the visualized portions of the skeleton.

CT ABDOMEN PELVIS FINDINGS

Hepatobiliary: Subcentimeter low-attenuation lesion in segment 3 of
the liver, too small to characterize, but statistically likely to
represent a tiny cyst. Area of hypoenhancement in the medial aspect
of segment 4B of the liver adjacent to the falciform ligament, most
characteristic for a benign perfusion anomaly. No other definite
suspicious cystic or solid hepatic lesions. No intra or extrahepatic
biliary ductal dilatation. Gallbladder is nearly decompressed, but
otherwise unremarkable in appearance.

Pancreas: No pancreatic mass. No pancreatic ductal dilatation. No
pancreatic or peripancreatic fluid collections or inflammatory
changes.

Spleen: Multiple small calcified granulomas scattered throughout the
spleen.

Adrenals/Urinary Tract: Bilateral kidneys and adrenal glands are
normal in appearance. No hydroureteronephrosis. Foley balloon
catheter present within the lumen of the urinary bladder. Small
amount of gas non dependently in the lumen of the urinary bladder is
iatrogenic. Mild thickening of the posterior wall of the urinary
bladder adjacent to the tip of the Foley catheter where there is
urothelial hyperenhancement, favored to be reactive to the
indwelling catheter.

Stomach/Bowel: Normal appearance of the stomach. No pathologic
dilatation of small bowel or colon. Focal area of nodular soft
tissue thickening in the mid rectal wall (axial image 104 of series
2 and coronal image 113 of series 3), suspicious for primary rectal
neoplasm, estimated to measure approximately 3.2 x 2.8 x 3.9 cm.
Adjacent to this there is extensive abnormal enhancing soft tissue
in the mesorectal fat extending into the right seminal vesicle. This
soft tissue is highly irregular in shape and therefore difficult to
accurately measure, but is estimated to measure approximately 4.2 x
6.5 x 3.9 cm (axial image 103 of series 2 and coronal image 95 of
series 3). The appendix is not confidently identified and may be
surgically absent. Regardless, there are no inflammatory changes
noted adjacent to the cecum to suggest the presence of an acute
appendicitis at this time.

Vascular/Lymphatic: Aortic atherosclerosis, without evidence of
aneurysm or dissection in the abdominal or pelvic vasculature. There
is extensive enhancing lymphadenopathy noted throughout the pelvis
and retroperitoneum. The largest retroperitoneal lymph nodes measure
up to 1.5 cm in short axis in the left para-aortic nodal station
(axial image 72 of series 2). The largest pelvic lymph node is along
the left pelvic sidewall in the internal iliac nodal chain measuring
up to 2.6 cm in short axis (axial image 97 of series 2). Multiple
other smaller borderline enlarged and enlarged lymph nodes are noted
elsewhere in the pelvis and retroperitoneum.

Reproductive: Right seminal vesicle appears diffusely infiltrated by
abnormal enhancing soft tissue (discussed above). Prostate gland and
left seminal vesicle are grossly unremarkable in appearance.

Other: No significant volume of ascites.  No pneumoperitoneum.

Musculoskeletal: There are no aggressive appearing lytic or blastic
lesions noted in the visualized portions of the skeleton.
IMPRESSION: 1. Findings are concerning for primary rectal neoplasm with
metastatic disease involving the mesorectal lymph nodes which appear
contiguous with enhancing soft tissue in the right seminal vesicle,
as well as extensive pelvic and retroperitoneal lymphadenopathy, as
detailed above. No signs of metastatic disease in the thorax.
2. Aortic atherosclerosis, in addition to 3 vessel coronary artery
disease.
3. There are calcifications of the aortic valve. Echocardiographic
correlation for evaluation of potential valvular dysfunction may be
warranted if clinically indicated.
4. Additional incidental findings, as above.

## 2020-03-05 MED ORDER — IOPAMIDOL (ISOVUE-300) INJECTION 61%
100.0000 mL | Freq: Once | INTRAVENOUS | Status: AC | PRN
  Administered 2020-03-05: 100 mL via INTRAVENOUS

## 2020-03-09 DIAGNOSIS — C775 Secondary and unspecified malignant neoplasm of intrapelvic lymph nodes: Secondary | ICD-10-CM | POA: Diagnosis not present

## 2020-03-09 DIAGNOSIS — R339 Retention of urine, unspecified: Secondary | ICD-10-CM | POA: Diagnosis not present

## 2020-03-09 DIAGNOSIS — M818 Other osteoporosis without current pathological fracture: Secondary | ICD-10-CM | POA: Diagnosis not present

## 2020-03-11 ENCOUNTER — Other Ambulatory Visit: Payer: Self-pay

## 2020-03-11 NOTE — Progress Notes (Signed)
The proposed treatment discussed in conference is for discussion purposes only and is not a binding recommendation.  The patients have not been physically examined, or presented with their treatment options.  Therefore, final treatment plans cannot be decided.   

## 2020-03-16 DIAGNOSIS — C20 Malignant neoplasm of rectum: Secondary | ICD-10-CM | POA: Diagnosis not present

## 2020-04-07 NOTE — Progress Notes (Signed)
GI Location of Tumor / Histology: Colorectal Cancer  [name] presented to ER with abdominal pain.  Imaging revealed acute appendicitis and incidental finding of rectal mass.  CT CAP 03/06/2020: Findings are concerning for primary rectal neoplasm with metastatic disease involving the mesorectal lymph nodes which appear contiguous with enhancing soft tissue in the right seminal vesicle, as well as extensive pelvic and retroperitoneal lymphadenopathy, as detailed above. No signs of metastatic disease in the thorax.  MRI Pelvis 02/21/2020: Locally advanced rectal cancer with involvement of prostate, seminal vesicles and APR with extensive nodal disease that extends into the retroperitoneum. Pattern of disease is high risk for distant disease based on appearance of iliac nodes on the LEFT and the extensive involvement in the inferior peritoneal recess/APR. Scanning of the abdomen and pelvis and potentially even the chest is suggested if not yet performed.  CT Abd/Pelvis 11/11/2019:  Abnormal distention of the appendix, measuring up to 14 mm, with surrounding induration, highly worrisome for developing acute appendicitis. Findings highly worrisome for rectal carcinoma with bilateral moderate to severe pelvic lymphadenopathy.Irregular 3 cm mass in the mid rectum, 10 cm above the anus. Negative for bowel obstruction.     Biopsies of Rectal Mass 01/09/2020    Past/Anticipated interventions by surgeon, if any:  Dr. Marcello Moores 03/16/2020 - Male with known prostate cancer now has a rectal cancer in a similar location. -There does not appear to be a surgery that can be completed that will treat his rectal cancer without disturbing his prostate cancer and I do not feel that he can tolerate a pelvic exenteration. -Discussion at GI tumor board recommendation for palliative radiation. -He will continue to follow up with his urologist for his indwelling foley catheter and prostate cancer. -We discussed that  there is a risk of complete obstruction due to his rectal cancer.  We discussed that radiation may alleviate this somewhat, but if it does not, he would need a diverting colostomy.  Past/Anticipated interventions by medical oncology, if any:    Weight changes, if any: Lost about 4 pounds prior to surgery in December.  Bowel/Bladder complaints, if any: Has some constipation.  He is taking laxatives and stool softeners.   Nausea / Vomiting, if any: No  Pain issues, if any:  No  Any blood per rectum: None   SAFETY ISSUES:  Prior radiation? No  Pacemaker/ICD? No  Possible current pregnancy? n/a  Is the patient on methotrexate? no  Current Complaints/Details: -Undergoing treatment for prostate cancer with Dr. Louis Meckel- On hormonal therapy  -Continues to have lower extremity edema, Left worse than Right, better at night with legs elevated.  No pain associated, patient states it feels warm to tough and they feel tight.  This started when he was in the hospital in December 2021.

## 2020-04-08 ENCOUNTER — Encounter: Payer: Self-pay | Admitting: Radiation Oncology

## 2020-04-08 ENCOUNTER — Other Ambulatory Visit: Payer: Self-pay

## 2020-04-08 ENCOUNTER — Ambulatory Visit
Admission: RE | Admit: 2020-04-08 | Discharge: 2020-04-08 | Disposition: A | Discharge status: Home / Self Care | Payer: Medicare Other | Source: Ambulatory Visit | Attending: Radiation Oncology | Admitting: Radiation Oncology

## 2020-04-08 DIAGNOSIS — C61 Malignant neoplasm of prostate: Secondary | ICD-10-CM | POA: Diagnosis not present

## 2020-04-08 DIAGNOSIS — C20 Malignant neoplasm of rectum: Secondary | ICD-10-CM

## 2020-04-08 HISTORY — DX: Malignant neoplasm of prostate: C61

## 2020-04-08 NOTE — Addendum Note (Signed)
Encounter addended by: Cori Razor, RN on: 04/08/2020 4:55 PM  Actions taken: Flowsheet accepted

## 2020-04-08 NOTE — Addendum Note (Signed)
Encounter addended by: Cori Razor, RN on: 04/08/2020 4:55 PM  Actions taken: Charge Capture section accepted

## 2020-04-08 NOTE — Progress Notes (Signed)
Radiation Oncology         [phone] ________________________________  Name: Martin Foster        MRN: [id number]  Date of Service: 04/08/2020 DOB: [birth year]  VX:[id number], Martin Booty, MD  Martin Ruff, MD     REFERRING PHYSICIAN: Leighton Ruff, MD   DIAGNOSIS: Diagnoses of Adenocarcinoma of rectum [facility]) and Adenocarcinoma of prostate [facility]) were pertinent to this visit.   HISTORY OF PRESENT ILLNESS: Martin Foster is a 87 y.o. male seen at the request of Dr. Leighton Foster for newly diagnosed carcinoma of the rectum.  The patient has a longstanding history of high risk adenocarcinoma of the prostate when he was originally diagnosed with Gleason 4+5 cancer in 2016.  He was treated with hormonal therapy and has been receiving Eligard since February 2016.  His PSA has continued to be elevated, and in the fall 2021.  He started to develop lower extremity edema and was seen in the emergency room in November 2021 with CT imaging findings concerning for appendicitis, he also had a large mass in the rectum with moderate to severe pelvic lymphadenopathy.  He ultimately met with Dr. Michail Foster and elected to proceed with colonoscopy on 01/09/2020 which showed a malignant appearing tumor in the proximal rectum from 10 to 15 cm proximal to the anus, biopsies were obtained and revealed tubulovillous adenoma with high-grade dysplasia and focal areas suspicious for invasive adenocarcinoma.  Given these findings he was counseled on the role for meeting with colorectal surgery and to continue his follow-up with urology.  He saw Dr. Louis Foster back in February 2022 and plans are to continue with Eligard but also with Prolia.  He was also referred to Dr. Marcello Foster to discuss options of diverting colostomy.  He has met with her and underwent an MRI of the pelvis which shows a locally advanced rectal cancer involving the prostate, seminal vesicles with extensive nodal disease into the retroperitoneum with iliac nodes and  extensive peritoneal recess involvement, a CT scan chest abdomen pelvis on 03/05/2020 also showed the findings on MRI in the pelvis but no evidence of metastatic disease within the chest.  He does have stigmata of atherosclerotic disease and calcifications of his aortic valve.  He was offered diverting colostomy but declined, and is interested in hearing about options of radiotherapy.     PREVIOUS RADIATION THERAPY: No   PAST MEDICAL HISTORY:  Past Medical History:  Diagnosis Date  . Hypertension   . Renal disorder    catheter since 06/26/11       PAST SURGICAL HISTORY:No past surgical history on file.   FAMILY HISTORY: No family history on file.   SOCIAL HISTORY:  reports that he has quit smoking. He has never used smokeless tobacco. He reports that he does not drink alcohol and does not use drugs.  The patient is married and lives in [place].  ALLERGIES: Ampicillin, Ciprofibrate, Erythromycin, and Other   MEDICATIONS:  Current Outpatient Medications  Medication Sig Dispense Refill  . finasteride (PROSCAR) 5 MG tablet Take 5 mg by mouth daily.    Marland Kitchen lisinopril (PRINIVIL,ZESTRIL) 20 MG tablet Take 20 mg by mouth daily.    . Tamsulosin HCl (FLOMAX) 0.4 MG CAPS Take 0.4 mg by mouth daily.     No current facility-administered medications for this encounter.     REVIEW OF SYSTEMS: On review of systems, the patient reports that he is having swelling of his lower extremities left greater than right. He does have some discomfort in  trying to bend his knee and heaviness when the leg swells. At night this goes down, but he notes this reaccumulating toward the later half of the day. He does admit to occasional bright red blood per rectum but is able to pass stool and takes miralax several times a week to keep his bowels moving. He denies any pelvic pain, nausea, vomiting, or difficulty with weight loss. No other complaints are verbalized.    PHYSICAL EXAM:  Wt Readings from Last 3  Encounters:  04/08/20 155 lb 2 oz (70.4 kg)  09/13/18 152 lb (68.9 kg)   Temp Readings from Last 3 Encounters:  04/08/20 (!) 96.9 F (36.1 C) (Temporal)  08/19/11 98.7 F (37.1 C) (Oral)   BP Readings from Last 3 Encounters:  04/08/20 (!) 188/82  08/19/11 124/62   Pulse Readings from Last 3 Encounters:  04/08/20 85  08/19/11 72   Pain Assessment Pain Score: 3 /10  In general this is a elderly caucasian male in no acute distress. He's alert and oriented x4 and appropriate throughout the examination. Cardiopulmonary assessment is negative for acute distress and he exhibits normal effort.     ECOG = 1  0 - Asymptomatic (Fully active, able to carry on all predisease activities without restriction)  1 - Symptomatic but completely ambulatory (Restricted in physically strenuous activity but ambulatory and able to carry out work of a light or sedentary nature. For example, light housework, office work)  2 - Symptomatic, <50% in bed during the day (Ambulatory and capable of all self care but unable to carry out any work activities. Up and about more than 50% of waking hours)  3 - Symptomatic, >50% in bed, but not bedbound (Capable of only limited self-care, confined to bed or chair 50% or more of waking hours)  4 - Bedbound (Completely disabled. Cannot carry on any self-care. Totally confined to bed or chair)  5 - Death   Martin Foster MM, Martin Foster, Martin Foster, et al. [id number]). "Toxicity and response criteria of the [facility]". [place] Oncol. 5 (6): 649-55    LABORATORY DATA:  No results found for: WBC, HGB, HCT, MCV, PLT No results found for: NA, K, CL, CO2 No results found for: ALT, AST, GGT, ALKPHOS, BILITOT    RADIOGRAPHY: No results found.     IMPRESSION/PLAN: 1. At least Stage III, cT3N2M0 adenocarcinoma of the rectum. Dr. Lisbeth Renshaw discusses the pathology findings and reviews the nature of locally advanced rectal carcinoma. While we were originally  thinking he would only be able to tolerate a palliative course, the patient's quality of life and performance status is quite excellent. As such, Dr. Lisbeth Renshaw discusses the options of either a palliative course to the tumor, versus more of a definitive course of therapy to include the primary tumor and regional nodes. He is also aware of the option to meet with medical oncology to see if he is a candidate for chemosensitization after we discussed the course of chemoRT. We discussed the risks, benefits, short, and long term effects of radiotherapy, as well as the curative intent, and the patient is interested in proceeding. Dr. Lisbeth Renshaw discusses the delivery and logistics of radiotherapy and anticipates a course of 5 1/2 weeks of radiotherapy. Written consent is obtained and placed in the chart, a copy was provided to the patient. He will simulate on Tuesday next week and refer him to medical oncology. We plan to start treatment on 04/20/20.  2. High risk, Gleason 4+5 adenocarcinoma  of the prostate. He will continue Elligard and Prolia with Dr. Louis Foster as he pursues treatment for #1.  In a visit lasting 75 minutes, greater than 50% of the time was spent face to face discussing the patient's condition, in preparation for the discussion, and coordinating the patient's care.   The above documentation reflects my direct findings during this shared patient visit. Please see the separate note by Dr. Lisbeth Renshaw on this date for the remainder of the patient's plan of care.    Carola Rhine, [facility]   **Disclaimer: This note was dictated with voice recognition software. Similar sounding words can inadvertently be transcribed and this note may contain transcription errors which may not have been corrected upon publication of note.**

## 2020-04-10 ENCOUNTER — Telehealth: Payer: Self-pay | Admitting: Nurse Practitioner

## 2020-04-10 DIAGNOSIS — R339 Retention of urine, unspecified: Secondary | ICD-10-CM | POA: Diagnosis not present

## 2020-04-10 NOTE — Telephone Encounter (Signed)
Received a new pt referral from [name], Pa in radonc to sch a medonc appt. Mr. Martin Foster has been scheduled to see Lacie on 4/6 at 11am. I updated the navigator that I was unable to reach Mr. Martin Foster.

## 2020-04-13 NOTE — Progress Notes (Signed)
Spoke with patient's wife [name] to explain his appointments CT simulation 4/5 at 2:00 and he will see Cira Rue NP and Dr. Burr Medico on Wednesday 4/6 at 11:00.  She verbalized an understanding.

## 2020-04-14 ENCOUNTER — Ambulatory Visit
Admission: RE | Admit: 2020-04-14 | Discharge: 2020-04-14 | Disposition: A | Discharge status: Home / Self Care | Payer: Medicare Other | Source: Ambulatory Visit | Attending: Radiation Oncology | Admitting: Radiation Oncology

## 2020-04-14 ENCOUNTER — Other Ambulatory Visit: Payer: Self-pay

## 2020-04-14 DIAGNOSIS — Z510 Encounter for antineoplastic radiation therapy: Secondary | ICD-10-CM | POA: Insufficient documentation

## 2020-04-14 DIAGNOSIS — C61 Malignant neoplasm of prostate: Secondary | ICD-10-CM | POA: Insufficient documentation

## 2020-04-14 DIAGNOSIS — C20 Malignant neoplasm of rectum: Secondary | ICD-10-CM | POA: Diagnosis not present

## 2020-04-14 NOTE — Progress Notes (Addendum)
[place]  [phone] Fax:([phone]) [place] Note   Patient Care Team: Martin Jordan, MD as PCP - General (Family Medicine) Martin Merle, MD as Consulting Physician (Hematology) Martin Rudd, MD as Consulting Physician (Radiation Oncology) Martin Ruff, MD as Consulting Physician (General Surgery) Martin Hughs, MD as Attending Physician (Urology) Date of service: 04/15/2020   CHIEF COMPLAINTS/PURPOSE OF CONSULTATION:  Rectal cancer, referred by radiation oncology Martin Simpson, PA  SUMMARY OF ONCOLOGY HISTORY  Oncology History  Adenocarcinoma of rectum ([place])  11/11/2019 Imaging   IMPRESSION:   1.  Abnormal distention of the appendix, measuring up to 14 mm, with surrounding induration,, highly worrisome for developing acute appendicitis.  2.  Gastroenteritis.  3.  Findings highly worrisome for rectal carcinoma with bilateral moderate to severe pelvic lymphadenopathy.  4.  Negative for bowel obstruction.  5.  Mild diffuse urinary bladder wall thickening, which may represent chronic cystitis.  6.  Small bilateral pleural effusions.  7.  Mild cardiomegaly, without pulmonary edema.    01/09/2020 Procedure   Flex sigmoidoscopy by Dr. Michail Foster showed a frond-like villous and fungating nonobstructing large mass in the proximal rectum and from 10 to 15 cm proximal to the anus.  The mass was partially circumferential involving two thirds of the lumen circumference.  The mass measured 5 cm in length.  No bleeding was present.  Area was tattooed and biopsies were taken.   01/09/2020 Initial Biopsy   Large intestine-rectum biopsy  -fragments of tubulovillous adenoma with high grade dysplasia and focal area suspicious for invasion  Large intestine-rectum, distal -Fragments of tubulovillous adenoma   02/21/2020 Imaging   MRI pelvis for local staging IMPRESSION: Locally advanced rectal cancer with involvement of prostate, seminal vesicles  and APR with extensive nodal disease that extends into the retroperitoneum. Pattern of disease is high risk for distant disease based on appearance of iliac nodes on the LEFT and the extensive involvement in the inferior peritoneal recess/APR.     03/05/2020 Imaging   CT CAP IMPRESSION: 1. Findings are concerning for primary rectal neoplasm with metastatic disease involving the mesorectal lymph nodes which appear contiguous with enhancing soft tissue in the right seminal vesicle, as well as extensive pelvic and retroperitoneal lymphadenopathy, as detailed above. No signs of metastatic disease in the thorax. 2. Aortic atherosclerosis, in addition to 3 vessel coronary artery disease. 3. There are calcifications of the aortic valve. Echocardiographic correlation for evaluation of potential valvular dysfunction may be warranted if clinically indicated. 4. Additional incidental findings, as above.   04/08/2020 Initial Diagnosis   Adenocarcinoma of rectum [facility])    Cancer Staging   Cancer Staging Adenocarcinoma of rectum [facility]) Staging form: Colon and Rectum, AJCC 8th Edition - Clinical stage from 04/15/2020: cT4, cN2, cM1 - Signed by Martin Feeling, NP on 04/15/2020    04/15/2020 Cancer Staging   Staging form: Colon and Rectum, AJCC 8th Edition - Clinical stage from 04/15/2020: cT4, cN2, cM1 - Signed by Martin Feeling, NP on 04/15/2020     HISTORY OF PRESENTING ILLNESS:  Martin Foster 87 y.o. male with past medical history of prostate cancer on hormone therapy and Prolia, indwelling Foley catheter, and HTN is here because of newly diagnosed rectal cancer.  He was recently admitted to [facility] in 11/2019 for acute appendicitis, CT 11/11/2019 showed incidental enhancing 3 cm mass in the mid rectum about 10 cm above the anus as well as left iliac and right posterior pelvic lymphadenopathy,  index left iliac lymph node measuring 3.5 x 3 cm.  The right pelvic lymphadenopathy was somewhat confluent with  the prostate gland and right seminal vesicle.  He underwent diagnostic flex sigmoid on 01/09/2020 by Dr. Michail Foster which showed a frond-like/villous and fungating nonobstructing large mass in the proximal rectum 10-15 cm proximal to the anus which was partially circumferential involving two thirds of the lumen measuring 5 cm in length.  No bleeding was present.  This was tattooed and biopsied, path showed fragments of tubulovillous adenoma with high-grade dysplasia and focal area suspicious for invasion.  Staging MRI of the pelvis on 02/21/2020 showed locally advanced disease with a large mass contiguous with the anterior rectum tracking along the anterior peritoneal reflection extending into the seminal vesicles where the tumor extends beyond the wall of the rectum measuring 5.2 x 4.9 cm in greatest dimension.  There is involvement of the prostate and seminal vesicles and abutting the posterior margin of the urinary bladder.  Multiple lymph nodes involved throughout the pelvis including a left pelvic sidewall node measuring 4.4 x 2.8 cm.  Complete staging CT CAP on 03/05/2020 showed extensive pelvic and retroperitoneal lymphadenopathy consistent with metastatic disease, chest was negative.  The case was discussed in GI tumor board, he was seen by colorectal surgeon Dr. Leighton Foster on 05/18/5275 who did not feel he could tolerate a pelvic exoneration.  He was therefore referred for radiation, seen by Dr. Lisbeth Foster on 04/08/2020 who recommended a palliative course.  He was referred to medical oncology for additional recommendations for management.  Socially he is married and lives with his wife, he is independent with all ADLs and lives an active lifestyle.  He takes stairs without problem and does yard work and makes Futures trader.  Denies alcohol, tobacco, or other drug use.  He had 3 children, a daughter had breast cancer in her 12s and lived 38 years but is deceased.  Another daughter died in her 22s during  childbirth.  He has a son age 50 with heart history.  Patient's father had colon cancer and mother had cancer in her abdomen which was felt to be GYN-related.  Today he is accompanied by his wife.  Energy and appetite are adequate but taste is changed.  His weight is stable.  He has left greater than right leg swelling from the groin down, with calf tightness but no pain.  Has 1-2 soft bowel movements per day with stool softener every few days.  He had 1 episode of bright red blood with wiping a few weeks ago but no recent blood in stool.  Denies rectal or other pain, N/V, recent infection, or other concerns.   MEDICAL HISTORY:  Past Medical History:  Diagnosis Date  . Hypertension   . Prostate cancer ([place])   . Rectal cancer ([place]) 01/09/2020  . Renal disorder    catheter since 06/26/11    SURGICAL HISTORY: Past Surgical History:  Procedure Laterality Date  . APPENDECTOMY  11/2019    SOCIAL HISTORY: Social History   Socioeconomic History  . Marital status: Married    Spouse name: Not on file  . Number of children: Not on file  . Years of education: Not on file  . Highest education level: Not on file  Occupational History  . Not on file  Tobacco Use  . Smoking status: Former Research scientist (life sciences)  . Smokeless tobacco: Never Used  Substance and Sexual Activity  . Alcohol use: No  . Drug use: No  . Sexual  activity: Not on file  Other Topics Concern  . Not on file  Social History Narrative  . Not on file   Social Determinants of Health   Financial Resource Strain: Not on file  Food Insecurity: Not on file  Transportation Needs: Not on file  Physical Activity: Not on file  Stress: Not on file  Social Connections: Not on file  Intimate Partner Violence: Not on file    FAMILY HISTORY: Family History  Problem Relation Age of Onset  . Gastric cancer Mother   . Prostate cancer Father   . Colon cancer Father     ALLERGIES:  is allergic to lisinopril, olmesartan medoxomil-hctz,  ampicillin, ciprofibrate, ciprofloxacin, erythromycin, fd&c yellow #6 [yellow dye], and other.  MEDICATIONS:  Current Outpatient Medications  Medication Sig Dispense Refill  . abiraterone acetate (ZYTIGA) 250 MG tablet Take 1,000 mg by mouth daily.    . Ascorbic Acid (VITAMIN C) 500 MG CHEW 1 tablet    . cholecalciferol (VITAMIN D3) 25 MCG (1000 UNIT) tablet 1 tablet    . cyanocobalamin 100 MCG tablet Take by mouth.    . denosumab (PROLIA) 60 MG/ML SOSY injection 60 mg    . ferrous sulfate 325 (65 FE) MG tablet Take 325 mg by mouth daily.    Marland Kitchen leuprolide (LUPRON DEPOT, 82-MONTH,) 3.75 MG injection     . losartan (COZAAR) 100 MG tablet Take 100 mg by mouth daily.    . Misc Natural Products (OSTEO BI-FLEX ADV DOUBLE ST PO) 1 tablet    . Multiple Vitamins-Minerals (MULTIVITAMIN ADULT EXTRA C PO) 1 tablet    . predniSONE 5 MG/ML concentrated solution 1 ml    . Tamsulosin HCl (FLOMAX) 0.4 MG CAPS Take 0.4 mg by mouth daily.    . vitamin E 180 MG (400 UNITS) capsule 1 capsule     No current facility-administered medications for this visit.    REVIEW OF SYSTEMS:   Constitutional: Denies fevers, chills or abnormal night sweats Eyes: Denies blurriness of vision, double vision or watery eyes Ears, nose, mouth, throat, and face: Denies mucositis or sore throat Respiratory: Denies cough, dyspnea or wheezes Cardiovascular: Denies palpitation, chest discomfort or lower extremity swelling Gastrointestinal:  Denies nausea, vomiting, constipation, diarrhea, heartburn or change in bowel habits (+) 1-2 soft BM per day with stool softeners (+) BRBPR with wiping x1 Skin: Denies abnormal skin rashes Lymphatics: Denies new lymphadenopathy or easy bruising  Neurological:Denies numbness, tingling or new weaknesses Behavioral/Psych: Mood is stable, no new changes  All other systems were reviewed with the patient and are negative.  PHYSICAL EXAMINATION: ECOG PERFORMANCE STATUS: 1 - Symptomatic but completely  ambulatory  Vitals:   04/15/20 1118  BP: (!) 192/75  Pulse: 68  Resp: 15  Temp: 98.5 F (36.9 C)  SpO2: 99%   Filed Weights   04/15/20 1118  Weight: 154 lb 8 oz (70.1 kg)    GENERAL:alert, no distress and comfortable SKIN: no rash  EYES:  sclera clear NECK: without mass  LYMPH:  no palpable cervical, supraclavicular, or inguinal lymphadenopathy LUNGS: clear with normal breathing effort HEART: regular rate & rhythm, mild to moderate L>R pitting lower extremity edema ABDOMEN: abdomen soft, non-tender and normal bowel sounds PSYCH: alert & oriented x 3 with fluent speech NEURO: no focal motor/sensory deficits  LABORATORY DATA:  I have reviewed the data as listed No flowsheet data found.  RADIOGRAPHIC STUDIES: I have personally reviewed the radiological images as listed and agreed with the findings in the report.  No results found.  ASSESSMENT & PLAN: 87 yo male with   1. Rectal mass  -We reviewed his outside medical record and work up in detail. During admission for acute appendicitis in 11/2019 he had incidental finding of rectal mass -flex sigmoidoscopy by Dr. Michail Foster showed a partially circumferential rectal mass 10-15 cm from anus. Path showed tubulovillous adenoma with high grade dysplasia and a focal area suspicious for invasion. The suspicion for malignancy is very high, but not definitive on this sample -staging MR pelvis 02/21/20 showed locally advanced disease invading the prostate and seminal vesicles and abutting the bladder, with significant pelvic adenopathy concerning for nodal metastasis  -CT 03/05/20 showed retroperitoneal lymphadenopathy, no metastatic disease in the chest.  -he was seen by colorectal surgeon Dr. Leighton Foster who felt the patient is not a candidate for pelvic exoneration, and he is not interested in this extensive surgery -He was seen by Dr. Lisbeth Foster who discussed a 5 1/2 week course of palliative radiation to the primary tumor and including the  pelvic LNs. He has loose but consistent BMs, the goal is to avoid obstruction.  -He also has L>R LE edema likely related to extrinsic compression on pelvic LNs, RT will likely help.  -We discussed the possibility that he has 2 synchronous primary malignancies, prostate and rectal. We also discussed the clinical picture is concerning for metastatic prostate cancer invading the rectum and pelvic lymph nodes given his rising PSA. Unfortunately on imaging it is difficult to tell if this is prostate cancer invading the rectum or vise versa.  -we are recommending PET F18 to further evaluate. If the rectal mass and pelvic LNs are PET positive, then this is very likely metastatic prostate cancer to rectum and nodes. He would benefit from systemic chemo ( such as taxotere) for metastatic prostate cancer  -If rectal mass and pelvic LNs are negative on PET, this supports 2 separate primaries and we would recommend radiosensitizing Xeloda with radiation.   -we briefly discussed risk/benefit, potential toxicities, symptom management, and palliative goal of Xeloda. He is elderly but has relatively very good PS, he would likely tolerate well.  -Will obtain baseline labs, doppler to r/o LE DVT, and F18 PET scan then see him back to review results and finalize treatment plan  2. Adenocarcinoma of prostate, Gleason score 4+5 = 9, pretreatment PSA 28, diagnosed 01/26/2014  -Followed by Dr. Louis Meckel of [facility], Dr. Burr Medico spoke with him to review case  -treated with hormone therapy last Lupron 07/08/2019, also on Zytiga  -since 2020-2021 he has rising PSA, indicating biochemical recurrence  -Per Dr. Carlton Adam office note last bone scan 09/2018 NED, but CT AP showed increased left-sided pelvic sidewall lymphadenopathy concerning for metastatic disease. I have bone scan report but CT is not available for review -Dr. Louis Meckel has recommended escalating treatment but patient declined intensive chemo in the past  -He has  indwelling catheter for urinary retention  3. Genetics  -He has 3 children, 1 daughter was diagnosed with breast cancer in her 42's and is deceased (not from breast cancer), and another daughter died at age 78 in childbirth. The patient's son has heart history but is alive and well at age 21.  -His father had colon cancer and mother had what pt feels was gyn malignancy -Given Martin Foster's family history and personal h/o prostate cancer, and now possibly a second primary, he qualifies for genetics  -will discuss genetic testing at another date, patient was overwhelmed at consult and results will  not likely change his plan as of now.  4. L>R LE edema -likely secondary to pelvic lymphadenopathy  -Left leg is slightly erythematous, not warm. I do not suspect cellulitis and suspicion for DVT is low but will obtain doppler to r/o thrombosis   PLAN: -Medical record and work up reviewed -F18 PET scan to further evaluate disease  -Baseline lab and LE doppler this week to r/o DVT -Will call after PET scan to finalize treatment plan  -Pending palliative RT per Dr. Lisbeth Foster, start date moved to 4/25 -Will communicate with medical team   -F/up with chemoRT start or after RT if he does not proceed with Xeloda     Orders Placed This Encounter  Procedures  . NM PET (F18-PYLARIFY) SKULL TO MID THIGH    Standing Status:   Future    Standing Expiration Date:   04/15/2021    Order Specific Question:   If indicated for the ordered procedure, I authorize the administration of a radiopharmaceutical per Radiology protocol    Answer:   Yes    Order Specific Question:   Preferred imaging location?    Answer:   [facility]  . CBC with Differential (Cancer Center Only)    Standing Status:   Standing    Number of Occurrences:   50    Standing Expiration Date:   04/15/2021  . CMP ([facility] only)    Standing Status:   Standing    Number of Occurrences:   50    Standing Expiration Date:   04/15/2021  . CEA (IN  HOUSE-[facility])*DO NOT USE AT [facility]*    Standing Status:   Standing    Number of Occurrences:   20    Standing Expiration Date:   04/15/2021  . PSA, total and free    Standing Status:   Standing    Number of Occurrences:   5    Standing Expiration Date:   04/15/2021  . Prostate-Specific AG, Serum    Standing Status:   Standing    Number of Occurrences:   5    Standing Expiration Date:   04/15/2021  . Iron and TIBC    Standing Status:   Standing    Number of Occurrences:   20    Standing Expiration Date:   04/15/2021  . Ferritin    Standing Status:   Standing    Number of Occurrences:   20    Standing Expiration Date:   04/15/2021    All questions were answered. The patient knows to call the clinic with any problems, questions or concerns.    Martin Feeling, NP 04/16/2020    Addendum  I have seen the patient, examined him. I agree with the assessment and and plan and have edited the notes.   87 year old male, with past medical history of castration resistant metastatic prostate cancer, currently on Zytiga and prednisone, hypertension, presented with newly diagnosed rectal cancer.  I have personally reviewed his recent MRI and CT scan images, colonoscopy findings, and biopsy results, and discussed with patient in detail.  I spoke with his urologist Dr. Louis Meckel. Both of [place] are concerned that his pelvic adenopathy could be related to his metastatic prostate cancer.  There is a small possibility that his rectal tumor is also direct extension from his prostate cancer.  I recommend F18-Pylarify PET scan for further evaluation.I also discussed his case with Dr. Lisbeth Foster and Bryson Ha. They plan to postpone his radiation until PET scan is done.  He  certainly could have both prostate cancer and rectal cancer, I discussed the role of concurrent Xeloda with radiation.  Patient is not a candidate for rectal surgery due to his advanced age and medical comorbidities, especially prostate cancer. If this is all  related to metastatic prostate cancer, I recommend him to proceed with radiation first, most for disease control and symptom relief. I briefly discussed the option of chemotherapy with him and his wife today. Will get baseline lab. All questions were answered. I will see him back after PET scan.   Martin Foster  04/16/2020

## 2020-04-15 ENCOUNTER — Encounter: Payer: Self-pay | Admitting: Nurse Practitioner

## 2020-04-15 ENCOUNTER — Inpatient Hospital Stay: Payer: Medicare Other | Attending: Nurse Practitioner | Admitting: Nurse Practitioner

## 2020-04-15 ENCOUNTER — Other Ambulatory Visit: Payer: Self-pay

## 2020-04-15 ENCOUNTER — Telehealth: Payer: Self-pay

## 2020-04-15 VITALS — BP 192/75 | HR 68 | Temp 98.5°F | Resp 15 | Ht 65.0 in | Wt 154.5 lb

## 2020-04-15 DIAGNOSIS — C61 Malignant neoplasm of prostate: Secondary | ICD-10-CM | POA: Insufficient documentation

## 2020-04-15 DIAGNOSIS — Z87891 Personal history of nicotine dependence: Secondary | ICD-10-CM | POA: Insufficient documentation

## 2020-04-15 DIAGNOSIS — R590 Localized enlarged lymph nodes: Secondary | ICD-10-CM | POA: Insufficient documentation

## 2020-04-15 DIAGNOSIS — C20 Malignant neoplasm of rectum: Secondary | ICD-10-CM | POA: Insufficient documentation

## 2020-04-15 DIAGNOSIS — Z800 Family history of malignant neoplasm of digestive organs: Secondary | ICD-10-CM | POA: Insufficient documentation

## 2020-04-15 DIAGNOSIS — Z79899 Other long term (current) drug therapy: Secondary | ICD-10-CM | POA: Insufficient documentation

## 2020-04-15 DIAGNOSIS — I10 Essential (primary) hypertension: Secondary | ICD-10-CM | POA: Diagnosis not present

## 2020-04-15 DIAGNOSIS — Z803 Family history of malignant neoplasm of breast: Secondary | ICD-10-CM | POA: Insufficient documentation

## 2020-04-15 NOTE — Telephone Encounter (Signed)
Paged Dr. Louis Meckel with [facility] to Dr. Ernestina Penna cell phone

## 2020-04-16 ENCOUNTER — Telehealth: Payer: Self-pay

## 2020-04-16 ENCOUNTER — Encounter: Payer: Self-pay | Admitting: Nurse Practitioner

## 2020-04-16 ENCOUNTER — Telehealth: Payer: Self-pay | Admitting: Radiation Oncology

## 2020-04-16 NOTE — Telephone Encounter (Signed)
The soonest available time to schedule the PET scan is on 4/21 at 2:30 pm. Scheduling advised me that if a sooner appointment opened up then they would contact the patient and reschedule. Mrs. Martin Foster has been made aware of this and verbalized understanding.

## 2020-04-16 NOTE — Telephone Encounter (Signed)
Patient's wife [name] calls stating confused about the plan.  I have clarified the plan of care with Cira Rue NP and informed her the plan is to obtain a special PET scan that will give [place] the information we need in order to best treat him.  As it stands we do not know if this is his prostate cancer invading into the rectum or rectal cancer invading into the prostate or two separate cancers.  I have explained they are seeking prior approval from his insurance company and then once approved will be scheduled.  She knows to expect a call from Radiology scheduler with this appointment.

## 2020-04-16 NOTE — Progress Notes (Signed)
Met with patient and his wife [name] at initial medical oncology consult with Cira Rue NP and Dr. Truitt Merle.  I explained my role as nurse navigator and they were given my direct contact information.

## 2020-04-16 NOTE — Telephone Encounter (Addendum)
Dr. Burr Medico is recommending specific PET scan to determine if this is locally advanced prostate vs rectal cancer, since rectal bx was not definitive and we can't tell from previous scans if this is prostate cancer invading rectum or vise versa. If rectal mass and pelvic nodes are negative on PET scan, she plans to will add Xeloda, but if not, consider systemic chemo for prostate cancer.   We will move his start date til 05/04/20 so his PET can be coordinated (currently scheduled for 04/30/20). We will make adjustments in his treatment if needed based on his PET imaging.

## 2020-04-16 NOTE — Telephone Encounter (Signed)
-----   Message from Alla Feeling, NP sent at 04/16/2020 11:39 AM EDT ----- Thanks, Lexine Baton. Santiago Glad or Myriam Jacobson, please call radiology to schedule F18 PET scan asap.   Bryson Ha and Dr. Lisbeth Renshaw,  We saw Mr. Mcgath yesterday and Dr. Burr Medico spoke to his urologist. His clinical picture is concerning for metastatic prostate cancer given rising PSA. We are recommending specific PET scan to determine if this is locally advanced prostate vs rectal cancer, since rectal bx was not definitive and we can't tell from previous scans if this is prostate cancer invading rectum or vise versa. If rectal mass and pelvic nodes are negative on PET scan, we will add Xeloda, but otherwise we will discuss systemic chemo for prostate cancer.   Hope that makes sense. You may want to postpone RT until PET scan. Pt was told that is a possibility.   Thanks, Regan Rakers  ----- Message ----- From: Aundria Rud Sent: 04/16/2020  11:14 AM EDT To: Alla Feeling, NP  Referral authorized ----- Message ----- From: Alla Feeling, NP Sent: 04/16/2020  10:40 AM EDT To: Otho Darner,  We ordered a specific PET scan, please help to get approved asap. I may need to do P2P and hope to do that either today or tomorrow so we can get scan scheduled.   Thanks, Regan Rakers

## 2020-04-19 MED ORDER — CAPECITABINE 500 MG PO TABS
825.0000 mg/m2 | ORAL_TABLET | Freq: Two times a day (BID) | ORAL | 1 refills | Status: DC
Start: 2020-04-19 — End: 2021-05-14
  Filled 2020-04-19: qty 90, 15d supply, fill #0

## 2020-04-20 ENCOUNTER — Telehealth: Payer: Self-pay

## 2020-04-20 ENCOUNTER — Encounter: Payer: Self-pay | Admitting: Pharmacist

## 2020-04-20 ENCOUNTER — Other Ambulatory Visit (HOSPITAL_COMMUNITY): Payer: Self-pay

## 2020-04-20 ENCOUNTER — Ambulatory Visit: Payer: Medicare Other | Admitting: Radiation Oncology

## 2020-04-20 NOTE — Telephone Encounter (Signed)
Oral Oncology Patient Advocate Encounter  After completing a benefits investigation, prior authorization for Xeloda is not required at this time through [place].  Patient's copay is $32.79.  [place] Patient [place] Phone [phone] Fax [phone] 04/20/2020 8:40 AM

## 2020-04-21 ENCOUNTER — Ambulatory Visit: Payer: Medicare Other

## 2020-04-21 ENCOUNTER — Telehealth: Payer: Self-pay | Admitting: Hematology

## 2020-04-21 NOTE — Telephone Encounter (Signed)
Scheduled appt per 4/10 sch msg. Pt aware.

## 2020-04-22 ENCOUNTER — Ambulatory Visit: Payer: Medicare Other

## 2020-04-23 ENCOUNTER — Ambulatory Visit: Payer: Medicare Other

## 2020-04-24 ENCOUNTER — Ambulatory Visit: Payer: Medicare Other

## 2020-04-24 ENCOUNTER — Ambulatory Visit (HOSPITAL_COMMUNITY): Payer: Medicare Other | Attending: Nurse Practitioner

## 2020-04-24 ENCOUNTER — Inpatient Hospital Stay: Payer: Medicare Other

## 2020-04-27 ENCOUNTER — Ambulatory Visit: Payer: Medicare Other

## 2020-04-28 ENCOUNTER — Ambulatory Visit: Payer: Medicare Other

## 2020-04-29 ENCOUNTER — Ambulatory Visit: Payer: Medicare Other

## 2020-04-29 NOTE — Progress Notes (Incomplete)
[place]   [phone] [phone]   Clinic Follow up Note   Patient Care Team: [name], MD as PCP - General (Family Medicine) Truitt Merle, MD as Consulting Physician (Hematology) Kyung Rudd, MD as Consulting Physician (Radiation Oncology) Leighton Ruff, MD as Consulting Physician (General Surgery) Ardis Hughs, MD as Attending Physician (Urology)  Date of Service:  04/29/2020  CHIEF COMPLAINT: f/u of Rectal cancer  SUMMARY OF ONCOLOGIC HISTORY: Oncology History  Adenocarcinoma of rectum ([place])  11/11/2019 Imaging   IMPRESSION:   1.  Abnormal distention of the appendix, measuring up to 14 mm, with surrounding induration,, highly worrisome for developing acute appendicitis.  2.  Gastroenteritis.  3.  Findings highly worrisome for rectal carcinoma with bilateral moderate to severe pelvic lymphadenopathy.  4.  Negative for bowel obstruction.  5.  Mild diffuse urinary bladder wall thickening, which may represent chronic cystitis.  6.  Small bilateral pleural effusions.  7.  Mild cardiomegaly, without pulmonary edema.    01/09/2020 Procedure   Flex sigmoidoscopy by Dr. Michail Sermon showed a frond-like villous and fungating nonobstructing large mass in the proximal rectum and from 10 to 15 cm proximal to the anus.  The mass was partially circumferential involving two thirds of the lumen circumference.  The mass measured 5 cm in length.  No bleeding was present.  Area was tattooed and biopsies were taken.   01/09/2020 Initial Biopsy   Large intestine-rectum biopsy  -fragments of tubulovillous adenoma with high grade dysplasia and focal area suspicious for invasion  Large intestine-rectum, distal -Fragments of tubulovillous adenoma   02/21/2020 Imaging   MRI pelvis for local staging IMPRESSION: Locally advanced rectal cancer with involvement of prostate, seminal vesicles and APR with extensive nodal disease that extends into the  retroperitoneum. Pattern of disease is high risk for distant disease based on appearance of iliac nodes on the LEFT and the extensive involvement in the inferior peritoneal recess/APR.     03/05/2020 Imaging   CT CAP IMPRESSION: 1. Findings are concerning for primary rectal neoplasm with metastatic disease involving the mesorectal lymph nodes which appear contiguous with enhancing soft tissue in the right seminal vesicle, as well as extensive pelvic and retroperitoneal lymphadenopathy, as detailed above. No signs of metastatic disease in the thorax. 2. Aortic atherosclerosis, in addition to 3 vessel coronary artery disease. 3. There are calcifications of the aortic valve. Echocardiographic correlation for evaluation of potential valvular dysfunction may be warranted if clinically indicated. 4. Additional incidental findings, as above.   04/08/2020 Initial Diagnosis   Adenocarcinoma of rectum [facility])    Cancer Staging   Cancer Staging Adenocarcinoma of rectum [facility]) Staging form: Colon and Rectum, AJCC 8th Edition - Clinical stage from 04/15/2020: cT4, cN2, cM1 - Signed by Alla Feeling, NP on 04/15/2020    04/15/2020 Cancer Staging   Staging form: Colon and Rectum, AJCC 8th Edition - Clinical stage from 04/15/2020: cT4, cN2, cM1 - Signed by Alla Feeling, NP on 04/15/2020      CURRENT THERAPY:  ***  INTERVAL HISTORY: *** Martin Foster is here for a follow up. He presents to the clinic alone.    REVIEW OF SYSTEMS:  *** Constitutional: Denies fevers, chills or abnormal weight loss Eyes: Denies blurriness of vision Ears, nose, mouth, throat, and face: Denies mucositis or sore throat Respiratory: Denies cough, dyspnea or wheezes Cardiovascular: Denies palpitation, chest discomfort or lower extremity swelling Gastrointestinal:  Denies nausea, heartburn or change in bowel habits Skin: Denies  abnormal skin rashes Lymphatics: Denies new lymphadenopathy or easy  bruising Neurological:Denies numbness, tingling or new weaknesses Behavioral/Psych: Mood is stable, no new changes  All other systems were reviewed with the patient and are negative.  MEDICAL HISTORY:  Past Medical History:  Diagnosis Date  . Hypertension   . Prostate cancer ([place])   . Rectal cancer ([place]) 01/09/2020  . Renal disorder    catheter since 06/26/11    SURGICAL HISTORY: Past Surgical History:  Procedure Laterality Date  . APPENDECTOMY  11/2019    I have reviewed the social history and family history with the patient and they are unchanged from previous note.  ALLERGIES:  is allergic to lisinopril, olmesartan medoxomil-hctz, ampicillin, ciprofibrate, ciprofloxacin, erythromycin, fd&c yellow #6 [yellow dye], and other.  MEDICATIONS:  Current Outpatient Medications  Medication Sig Dispense Refill  . abiraterone acetate (ZYTIGA) 250 MG tablet Take 1,000 mg by mouth daily.    . Ascorbic Acid (VITAMIN C) 500 MG CHEW 1 tablet    . capecitabine (XELODA) 500 MG tablet Take 3 tablets (1,500 mg total) by mouth 2 (two) times daily after a meal. 90 tablet 1  . cholecalciferol (VITAMIN D3) 25 MCG (1000 UNIT) tablet 1 tablet    . cyanocobalamin 100 MCG tablet Take by mouth.    . denosumab (PROLIA) 60 MG/ML SOSY injection 60 mg    . ferrous sulfate 325 (65 FE) MG tablet Take 325 mg by mouth daily.    Marland Kitchen leuprolide (LUPRON DEPOT, 74-MONTH,) 3.75 MG injection     . losartan (COZAAR) 100 MG tablet Take 100 mg by mouth daily.    . Misc Natural Products (OSTEO BI-FLEX ADV DOUBLE ST PO) 1 tablet    . Multiple Vitamins-Minerals (MULTIVITAMIN ADULT EXTRA C PO) 1 tablet    . predniSONE 5 MG/ML concentrated solution 1 ml    . Tamsulosin HCl (FLOMAX) 0.4 MG CAPS Take 0.4 mg by mouth daily.    . vitamin E 180 MG (400 UNITS) capsule 1 capsule     No current facility-administered medications for this visit.    PHYSICAL EXAMINATION: ECOG PERFORMANCE STATUS: {CHL ONC ECOG  PS:[phone]}  There were no vitals filed for this visit. There were no vitals filed for this visit. *** GENERAL:alert, no distress and comfortable SKIN: skin color, texture, turgor are normal, no rashes or significant lesions EYES: normal, Conjunctiva are pink and non-injected, sclera clear {OROPHARYNX:no exudate, no erythema and lips, buccal mucosa, and tongue normal}  NECK: supple, thyroid normal size, non-tender, without nodularity LYMPH:  no palpable lymphadenopathy in the cervical, axillary {or inguinal} LUNGS: clear to auscultation and percussion with normal breathing effort HEART: regular rate & rhythm and no murmurs and no lower extremity edema ABDOMEN:abdomen soft, non-tender and normal bowel sounds Musculoskeletal:no cyanosis of digits and no clubbing  NEURO: alert & oriented x 3 with fluent speech, no focal motor/sensory deficits  LABORATORY DATA:  I have reviewed the data as listed No flowsheet data found.   No flowsheet data found.    RADIOGRAPHIC STUDIES: I have personally reviewed the radiological images as listed and agreed with the findings in the report. No results found.   ASSESSMENT & PLAN:  Martin Foster is a 87 y.o. male with   1. Rectal cancer -We reviewed his outside medical record and work up in detail. During admission for acute appendicitis in 11/2019 he had incidental finding of rectal mass -flex sigmoidoscopy by Dr. Michail Sermon showed a partially circumferential rectal mass 10-15 cm from anus. Path  showed tubulovillous adenoma with high grade dysplasia and a focal area suspicious for invasion. The suspicion for malignancy is very high, but not definitive on this sample -staging MR pelvis 02/21/20 showed locally advanced disease invading the prostate and seminal vesicles and abutting the bladder, with significant pelvic adenopathy concerning for nodal metastasis  -CT 03/05/20 showed retroperitoneal lymphadenopathy, no metastatic disease in the chest.   -he was seen by colorectal surgeon Dr. Leighton Ruff who felt the patient is not a candidate for pelvic exoneration, and he is not interested in this extensive surgery -He was seen by Dr. Lisbeth Renshaw who discussed a 5 1/2 week course of palliative radiation to the primary tumor and including the pelvic LNs. He has loose but consistent BMs, the goal is to avoid obstruction.  -He also has L>R LE edema likely related to extrinsic compression on pelvic LNs, RT will likely help.  -We discussed the possibility that he has 2 synchronous primary malignancies, prostate and rectal. We also discussed the clinical picture is concerning for metastatic prostate cancer invading the rectum and pelvic lymph nodes given his rising PSA. Unfortunately on imaging it is difficult to tell if this is prostate cancer invading the rectum or vise versa.  -we are recommending PET F18 to further evaluate. If the rectal mass and pelvic LNs are PET positive, then this is very likely metastatic prostate cancer to rectum and nodes. He would benefit from systemic chemo ( such as taxotere) for metastatic prostate cancer  -If rectal mass and pelvic LNs are negative on PET, this supports 2 separate primaries and we would recommend radiosensitizing Xeloda with radiation.   -we briefly discussed risk/benefit, potential toxicities, symptom management, and palliative goal of Xeloda. He is elderly but has relatively very good PS, he would likely tolerate well.  -Will obtain baseline labs, doppler to r/o LE DVT, and F18 PET scan then see him back to review results and finalize treatment plan  2. Adenocarcinoma of prostate, Gleason score 4+5 = 9, pretreatment PSA 28, diagnosed 01/26/2014  -Followed by Dr. Louis Meckel of [facility], Dr. Burr Medico spoke with him to review case  -treated with hormone therapy last Lupron 07/08/2019, also on Zytiga  -since 2020-2021 he has rising PSA, indicating biochemical recurrence  -Per Dr. Carlton Adam office note last bone  scan 09/2018 NED, but CT AP showed increased left-sided pelvic sidewall lymphadenopathy concerning for metastatic disease. I have bone scan report but CT is not available for review -Dr. Louis Meckel has recommended escalating treatment but patient declined intensive chemo in the past  -He has indwelling catheter for urinary retention  3. Genetics  -He has 3 children, 1 daughter was diagnosed with breast cancer in her 45's and is deceased (not from breast cancer), and another daughter died at age 64 in childbirth. The patient's son has heart history but is alive and well at age 45.  -His father had colon cancer and mother had what pt feels was gyn malignancy -Given Mr. Theall's family history and personal h/o prostate cancer, and now possibly a second primary, he qualifies for genetics  -will discuss genetic testing at another date, patient was overwhelmed at consult and results will not likely change his plan as of now.  4. L>R LE edema -likely secondary to pelvic lymphadenopathy  -Left leg is slightly erythematous, not warm. I do not suspect cellulitis and suspicion for DVT is low but will obtain doppler to r/o thrombosis   PLAN: -Medical record and work up reviewed -F18 PET scan to further  evaluate disease  -Baseline lab and LE doppler this week to r/o DVT -Will call after PET scan to finalize treatment plan  -Pending palliative RT per Dr. Lisbeth Renshaw, start date moved to 4/25 -Will communicate with medical team   -F/up with chemoRT start or after RT if he does not proceed with Xeloda    No problem-specific Assessment & Plan notes found for this encounter.   No orders of the defined types were placed in this encounter.  All questions were answered. The patient knows to call the clinic with any problems, questions or concerns. No barriers to learning was detected. The total time spent in the appointment was {CHL ONC TIME VISIT - VAPOL:[id number]}.     Joslyn Devon 04/29/2020   Oneal Deputy, am acting as scribe for Truitt Merle, MD.   {Add scribe attestation statement}

## 2020-04-30 ENCOUNTER — Ambulatory Visit (HOSPITAL_COMMUNITY): Admission: RE | Admit: 2020-04-30 | Payer: Medicare Other | Source: Ambulatory Visit

## 2020-04-30 ENCOUNTER — Ambulatory Visit: Payer: Medicare Other

## 2020-04-30 ENCOUNTER — Telehealth: Payer: Self-pay

## 2020-04-30 ENCOUNTER — Telehealth: Payer: Self-pay | Admitting: Hematology

## 2020-04-30 NOTE — Telephone Encounter (Signed)
I was informed that pt and his wife cancelled his PET scan. I called their home number and spoke with pt and his wife at same time. Mrs. Delbene states they are upset because his radiation was postponed. They do not want to do more scans (they agreed with PET scan when I saw them on 04/15/2020). He just want to proceed with radiation, which is scheduled to start on 4/27. Pt does not want any other treatment for his cancer. When I tried to explain why we need the PET scan, Mrs Weinberg repeated said "you are not our doctor we choose and we will follow what Dr. Marcello Moores recommend only". She does not wish to see me back. I offered her second opinion from our med/onc group or somewhere else and she declined. I will inform Dr. Lisbeth Renshaw and I do not plan to see him back.   Truitt Merle  04/30/2020

## 2020-04-30 NOTE — Telephone Encounter (Signed)
Lovena Le called from nuclear medicine letting us know that Mr Goodall did not come for his specialized PET scan today.  She spoke with his wife and she stated that the cancer center was not "his doctor"  and they do not want anything to do with the cancer center.

## 2020-05-01 ENCOUNTER — Ambulatory Visit: Payer: Medicare Other

## 2020-05-01 ENCOUNTER — Inpatient Hospital Stay: Payer: Medicare Other | Admitting: Hematology

## 2020-05-01 NOTE — Telephone Encounter (Signed)
Erroneous enconter

## 2020-05-04 ENCOUNTER — Ambulatory Visit: Payer: Medicare Other | Admitting: Radiation Oncology

## 2020-05-05 ENCOUNTER — Ambulatory Visit: Payer: Medicare Other

## 2020-05-05 DIAGNOSIS — C20 Malignant neoplasm of rectum: Secondary | ICD-10-CM | POA: Diagnosis not present

## 2020-05-05 DIAGNOSIS — Z510 Encounter for antineoplastic radiation therapy: Secondary | ICD-10-CM | POA: Diagnosis not present

## 2020-05-06 ENCOUNTER — Ambulatory Visit
Admission: RE | Admit: 2020-05-06 | Discharge: 2020-05-06 | Disposition: A | Discharge status: Home / Self Care | Payer: Medicare Other | Source: Ambulatory Visit | Attending: Radiation Oncology | Admitting: Radiation Oncology

## 2020-05-06 DIAGNOSIS — Z510 Encounter for antineoplastic radiation therapy: Secondary | ICD-10-CM | POA: Diagnosis not present

## 2020-05-06 DIAGNOSIS — C20 Malignant neoplasm of rectum: Secondary | ICD-10-CM | POA: Diagnosis not present

## 2020-05-07 ENCOUNTER — Ambulatory Visit
Admission: RE | Admit: 2020-05-07 | Discharge: 2020-05-07 | Disposition: A | Discharge status: Home / Self Care | Payer: Medicare Other | Source: Ambulatory Visit | Attending: Radiation Oncology | Admitting: Radiation Oncology

## 2020-05-07 ENCOUNTER — Other Ambulatory Visit: Payer: Self-pay

## 2020-05-07 DIAGNOSIS — C20 Malignant neoplasm of rectum: Secondary | ICD-10-CM | POA: Diagnosis not present

## 2020-05-07 DIAGNOSIS — Z510 Encounter for antineoplastic radiation therapy: Secondary | ICD-10-CM | POA: Diagnosis not present

## 2020-05-08 ENCOUNTER — Ambulatory Visit
Admission: RE | Admit: 2020-05-08 | Discharge: 2020-05-08 | Disposition: A | Discharge status: Home / Self Care | Payer: Medicare Other | Source: Ambulatory Visit | Attending: Radiation Oncology | Admitting: Radiation Oncology

## 2020-05-08 DIAGNOSIS — Z510 Encounter for antineoplastic radiation therapy: Secondary | ICD-10-CM | POA: Diagnosis not present

## 2020-05-08 DIAGNOSIS — C20 Malignant neoplasm of rectum: Secondary | ICD-10-CM | POA: Diagnosis not present

## 2020-05-08 NOTE — Progress Notes (Signed)
Pt here for patient teaching.  Pt given Radiation and You booklet and skin care instructions.  Reviewed areas of pertinence such as diarrhea, fatigue, hair loss, sexual and fertility changes, skin changes and urinary and bladder changes . Pt able to give teach back of to pat skin, use unscented/gentle soap, use baby wipes, have Imodium on hand, drink plenty of water and sitz bath,avoid applying anything to skin within 4 hours of treatment. Pt verbalizes understanding of information given and will contact nursing with any questions or concerns.     Http://rtanswers.org/treatmentinformation/whattoexpect/index   Martin Foster. Leonie Green, BSN

## 2020-05-11 ENCOUNTER — Ambulatory Visit
Admission: RE | Admit: 2020-05-11 | Discharge: 2020-05-11 | Disposition: A | Discharge status: Home / Self Care | Payer: Medicare Other | Source: Ambulatory Visit | Attending: Radiation Oncology | Admitting: Radiation Oncology

## 2020-05-11 ENCOUNTER — Other Ambulatory Visit: Payer: Self-pay

## 2020-05-11 DIAGNOSIS — Z510 Encounter for antineoplastic radiation therapy: Secondary | ICD-10-CM | POA: Insufficient documentation

## 2020-05-11 DIAGNOSIS — C61 Malignant neoplasm of prostate: Secondary | ICD-10-CM | POA: Diagnosis present

## 2020-05-11 DIAGNOSIS — C20 Malignant neoplasm of rectum: Secondary | ICD-10-CM | POA: Diagnosis not present

## 2020-05-12 ENCOUNTER — Ambulatory Visit
Admission: RE | Admit: 2020-05-12 | Discharge: 2020-05-12 | Disposition: A | Discharge status: Home / Self Care | Payer: Medicare Other | Source: Ambulatory Visit | Attending: Radiation Oncology | Admitting: Radiation Oncology

## 2020-05-12 DIAGNOSIS — Z510 Encounter for antineoplastic radiation therapy: Secondary | ICD-10-CM | POA: Diagnosis not present

## 2020-05-12 DIAGNOSIS — C20 Malignant neoplasm of rectum: Secondary | ICD-10-CM | POA: Diagnosis not present

## 2020-05-12 DIAGNOSIS — R339 Retention of urine, unspecified: Secondary | ICD-10-CM | POA: Diagnosis not present

## 2020-05-13 ENCOUNTER — Other Ambulatory Visit: Payer: Self-pay

## 2020-05-13 ENCOUNTER — Ambulatory Visit
Admission: RE | Admit: 2020-05-13 | Discharge: 2020-05-13 | Disposition: A | Discharge status: Home / Self Care | Payer: Medicare Other | Source: Ambulatory Visit | Attending: Radiation Oncology | Admitting: Radiation Oncology

## 2020-05-13 DIAGNOSIS — Z510 Encounter for antineoplastic radiation therapy: Secondary | ICD-10-CM | POA: Diagnosis not present

## 2020-05-13 DIAGNOSIS — C20 Malignant neoplasm of rectum: Secondary | ICD-10-CM | POA: Diagnosis not present

## 2020-05-14 ENCOUNTER — Ambulatory Visit
Admission: RE | Admit: 2020-05-14 | Discharge: 2020-05-14 | Disposition: A | Discharge status: Home / Self Care | Payer: Medicare Other | Source: Ambulatory Visit | Attending: Radiation Oncology | Admitting: Radiation Oncology

## 2020-05-14 DIAGNOSIS — Z510 Encounter for antineoplastic radiation therapy: Secondary | ICD-10-CM | POA: Diagnosis not present

## 2020-05-14 DIAGNOSIS — C20 Malignant neoplasm of rectum: Secondary | ICD-10-CM | POA: Diagnosis not present

## 2020-05-15 ENCOUNTER — Ambulatory Visit
Admission: RE | Admit: 2020-05-15 | Discharge: 2020-05-15 | Disposition: A | Discharge status: Home / Self Care | Payer: Medicare Other | Source: Ambulatory Visit | Attending: Radiation Oncology | Admitting: Radiation Oncology

## 2020-05-15 ENCOUNTER — Other Ambulatory Visit: Payer: Self-pay

## 2020-05-15 DIAGNOSIS — C20 Malignant neoplasm of rectum: Secondary | ICD-10-CM | POA: Diagnosis not present

## 2020-05-15 DIAGNOSIS — Z510 Encounter for antineoplastic radiation therapy: Secondary | ICD-10-CM | POA: Diagnosis not present

## 2020-05-18 ENCOUNTER — Other Ambulatory Visit: Payer: Self-pay

## 2020-05-18 ENCOUNTER — Ambulatory Visit
Admission: RE | Admit: 2020-05-18 | Discharge: 2020-05-18 | Disposition: A | Discharge status: Home / Self Care | Payer: Medicare Other | Source: Ambulatory Visit | Attending: Radiation Oncology | Admitting: Radiation Oncology

## 2020-05-18 DIAGNOSIS — D649 Anemia, unspecified: Secondary | ICD-10-CM | POA: Diagnosis not present

## 2020-05-18 DIAGNOSIS — E559 Vitamin D deficiency, unspecified: Secondary | ICD-10-CM | POA: Diagnosis not present

## 2020-05-18 DIAGNOSIS — I700 Atherosclerosis of aorta: Secondary | ICD-10-CM | POA: Diagnosis not present

## 2020-05-18 DIAGNOSIS — C20 Malignant neoplasm of rectum: Secondary | ICD-10-CM | POA: Diagnosis not present

## 2020-05-18 DIAGNOSIS — Z0000 Encounter for general adult medical examination without abnormal findings: Secondary | ICD-10-CM | POA: Diagnosis not present

## 2020-05-18 DIAGNOSIS — Z510 Encounter for antineoplastic radiation therapy: Secondary | ICD-10-CM | POA: Diagnosis not present

## 2020-05-18 DIAGNOSIS — I10 Essential (primary) hypertension: Secondary | ICD-10-CM | POA: Diagnosis not present

## 2020-05-18 DIAGNOSIS — E785 Hyperlipidemia, unspecified: Secondary | ICD-10-CM | POA: Diagnosis not present

## 2020-05-18 DIAGNOSIS — C439 Malignant melanoma of skin, unspecified: Secondary | ICD-10-CM | POA: Diagnosis not present

## 2020-05-18 DIAGNOSIS — R7303 Prediabetes: Secondary | ICD-10-CM | POA: Diagnosis not present

## 2020-05-18 DIAGNOSIS — H905 Unspecified sensorineural hearing loss: Secondary | ICD-10-CM | POA: Diagnosis not present

## 2020-05-18 DIAGNOSIS — M47816 Spondylosis without myelopathy or radiculopathy, lumbar region: Secondary | ICD-10-CM | POA: Diagnosis not present

## 2020-05-18 DIAGNOSIS — Z79899 Other long term (current) drug therapy: Secondary | ICD-10-CM | POA: Diagnosis not present

## 2020-05-19 ENCOUNTER — Ambulatory Visit
Admission: RE | Admit: 2020-05-19 | Discharge: 2020-05-19 | Disposition: A | Discharge status: Home / Self Care | Payer: Medicare Other | Source: Ambulatory Visit | Attending: Radiation Oncology | Admitting: Radiation Oncology

## 2020-05-19 DIAGNOSIS — Z510 Encounter for antineoplastic radiation therapy: Secondary | ICD-10-CM | POA: Diagnosis not present

## 2020-05-19 DIAGNOSIS — C20 Malignant neoplasm of rectum: Secondary | ICD-10-CM | POA: Diagnosis not present

## 2020-05-20 ENCOUNTER — Other Ambulatory Visit: Payer: Self-pay

## 2020-05-20 ENCOUNTER — Ambulatory Visit
Admission: RE | Admit: 2020-05-20 | Discharge: 2020-05-20 | Disposition: A | Discharge status: Home / Self Care | Payer: Medicare Other | Source: Ambulatory Visit | Attending: Radiation Oncology | Admitting: Radiation Oncology

## 2020-05-20 DIAGNOSIS — C20 Malignant neoplasm of rectum: Secondary | ICD-10-CM | POA: Diagnosis not present

## 2020-05-20 DIAGNOSIS — Z510 Encounter for antineoplastic radiation therapy: Secondary | ICD-10-CM | POA: Diagnosis not present

## 2020-05-21 ENCOUNTER — Ambulatory Visit
Admission: RE | Admit: 2020-05-21 | Discharge: 2020-05-21 | Disposition: A | Discharge status: Home / Self Care | Payer: Medicare Other | Source: Ambulatory Visit | Attending: Radiation Oncology | Admitting: Radiation Oncology

## 2020-05-21 DIAGNOSIS — Z510 Encounter for antineoplastic radiation therapy: Secondary | ICD-10-CM | POA: Diagnosis not present

## 2020-05-21 DIAGNOSIS — C20 Malignant neoplasm of rectum: Secondary | ICD-10-CM | POA: Diagnosis not present

## 2020-05-22 ENCOUNTER — Ambulatory Visit
Admission: RE | Admit: 2020-05-22 | Discharge: 2020-05-22 | Disposition: A | Discharge status: Home / Self Care | Payer: Medicare Other | Source: Ambulatory Visit | Attending: Radiation Oncology | Admitting: Radiation Oncology

## 2020-05-22 DIAGNOSIS — Z510 Encounter for antineoplastic radiation therapy: Secondary | ICD-10-CM | POA: Diagnosis not present

## 2020-05-22 DIAGNOSIS — C20 Malignant neoplasm of rectum: Secondary | ICD-10-CM | POA: Diagnosis not present

## 2020-05-25 ENCOUNTER — Ambulatory Visit: Payer: Medicare Other

## 2020-05-25 ENCOUNTER — Ambulatory Visit
Admission: RE | Admit: 2020-05-25 | Discharge: 2020-05-25 | Disposition: A | Discharge status: Home / Self Care | Payer: Medicare Other | Source: Ambulatory Visit | Attending: Radiation Oncology | Admitting: Radiation Oncology

## 2020-05-25 DIAGNOSIS — Z510 Encounter for antineoplastic radiation therapy: Secondary | ICD-10-CM | POA: Diagnosis not present

## 2020-05-25 DIAGNOSIS — C20 Malignant neoplasm of rectum: Secondary | ICD-10-CM | POA: Diagnosis not present

## 2020-05-26 ENCOUNTER — Other Ambulatory Visit: Payer: Self-pay

## 2020-05-26 ENCOUNTER — Ambulatory Visit
Admission: RE | Admit: 2020-05-26 | Discharge: 2020-05-26 | Disposition: A | Discharge status: Home / Self Care | Payer: Medicare Other | Source: Ambulatory Visit | Attending: Radiation Oncology | Admitting: Radiation Oncology

## 2020-05-26 DIAGNOSIS — Z510 Encounter for antineoplastic radiation therapy: Secondary | ICD-10-CM | POA: Diagnosis not present

## 2020-05-26 DIAGNOSIS — C20 Malignant neoplasm of rectum: Secondary | ICD-10-CM | POA: Diagnosis not present

## 2020-05-27 ENCOUNTER — Ambulatory Visit: Payer: Medicare Other

## 2020-05-27 ENCOUNTER — Ambulatory Visit
Admission: RE | Admit: 2020-05-27 | Discharge: 2020-05-27 | Disposition: A | Discharge status: Home / Self Care | Payer: Medicare Other | Source: Ambulatory Visit | Attending: Radiation Oncology | Admitting: Radiation Oncology

## 2020-05-27 DIAGNOSIS — C20 Malignant neoplasm of rectum: Secondary | ICD-10-CM | POA: Diagnosis not present

## 2020-05-27 DIAGNOSIS — Z510 Encounter for antineoplastic radiation therapy: Secondary | ICD-10-CM | POA: Diagnosis not present

## 2020-05-28 ENCOUNTER — Other Ambulatory Visit: Payer: Self-pay

## 2020-05-28 ENCOUNTER — Ambulatory Visit
Admission: RE | Admit: 2020-05-28 | Discharge: 2020-05-28 | Disposition: A | Discharge status: Home / Self Care | Payer: Medicare Other | Source: Ambulatory Visit | Attending: Radiation Oncology | Admitting: Radiation Oncology

## 2020-05-28 DIAGNOSIS — Z510 Encounter for antineoplastic radiation therapy: Secondary | ICD-10-CM | POA: Diagnosis not present

## 2020-05-28 DIAGNOSIS — C20 Malignant neoplasm of rectum: Secondary | ICD-10-CM | POA: Diagnosis not present

## 2020-05-29 ENCOUNTER — Ambulatory Visit
Admission: RE | Admit: 2020-05-29 | Discharge: 2020-05-29 | Disposition: A | Discharge status: Home / Self Care | Payer: Medicare Other | Source: Ambulatory Visit | Attending: Radiation Oncology | Admitting: Radiation Oncology

## 2020-05-29 DIAGNOSIS — C20 Malignant neoplasm of rectum: Secondary | ICD-10-CM | POA: Diagnosis not present

## 2020-05-29 DIAGNOSIS — Z510 Encounter for antineoplastic radiation therapy: Secondary | ICD-10-CM | POA: Diagnosis not present

## 2020-06-01 ENCOUNTER — Ambulatory Visit
Admission: RE | Admit: 2020-06-01 | Discharge: 2020-06-01 | Disposition: A | Discharge status: Home / Self Care | Payer: Medicare Other | Source: Ambulatory Visit | Attending: Radiation Oncology | Admitting: Radiation Oncology

## 2020-06-01 DIAGNOSIS — Z510 Encounter for antineoplastic radiation therapy: Secondary | ICD-10-CM | POA: Diagnosis not present

## 2020-06-01 DIAGNOSIS — C20 Malignant neoplasm of rectum: Secondary | ICD-10-CM | POA: Diagnosis not present

## 2020-06-02 ENCOUNTER — Ambulatory Visit
Admission: RE | Admit: 2020-06-02 | Discharge: 2020-06-02 | Disposition: A | Discharge status: Home / Self Care | Payer: Medicare Other | Source: Ambulatory Visit | Attending: Radiation Oncology | Admitting: Radiation Oncology

## 2020-06-02 ENCOUNTER — Other Ambulatory Visit: Payer: Self-pay

## 2020-06-02 DIAGNOSIS — C20 Malignant neoplasm of rectum: Secondary | ICD-10-CM | POA: Diagnosis not present

## 2020-06-02 DIAGNOSIS — Z510 Encounter for antineoplastic radiation therapy: Secondary | ICD-10-CM | POA: Diagnosis not present

## 2020-06-03 ENCOUNTER — Other Ambulatory Visit: Payer: Self-pay

## 2020-06-03 ENCOUNTER — Ambulatory Visit
Admission: RE | Admit: 2020-06-03 | Discharge: 2020-06-03 | Disposition: A | Discharge status: Home / Self Care | Payer: Medicare Other | Source: Ambulatory Visit | Attending: Radiation Oncology | Admitting: Radiation Oncology

## 2020-06-03 DIAGNOSIS — Z510 Encounter for antineoplastic radiation therapy: Secondary | ICD-10-CM | POA: Diagnosis not present

## 2020-06-03 DIAGNOSIS — C20 Malignant neoplasm of rectum: Secondary | ICD-10-CM | POA: Diagnosis not present

## 2020-06-04 ENCOUNTER — Ambulatory Visit
Admission: RE | Admit: 2020-06-04 | Discharge: 2020-06-04 | Disposition: A | Discharge status: Home / Self Care | Payer: Medicare Other | Source: Ambulatory Visit | Attending: Radiation Oncology | Admitting: Radiation Oncology

## 2020-06-04 DIAGNOSIS — Z510 Encounter for antineoplastic radiation therapy: Secondary | ICD-10-CM | POA: Diagnosis not present

## 2020-06-04 DIAGNOSIS — C20 Malignant neoplasm of rectum: Secondary | ICD-10-CM | POA: Diagnosis not present

## 2020-06-05 ENCOUNTER — Other Ambulatory Visit: Payer: Self-pay

## 2020-06-05 ENCOUNTER — Ambulatory Visit
Admission: RE | Admit: 2020-06-05 | Discharge: 2020-06-05 | Disposition: A | Discharge status: Home / Self Care | Payer: Medicare Other | Source: Ambulatory Visit | Attending: Radiation Oncology | Admitting: Radiation Oncology

## 2020-06-05 DIAGNOSIS — Z510 Encounter for antineoplastic radiation therapy: Secondary | ICD-10-CM | POA: Diagnosis not present

## 2020-06-05 DIAGNOSIS — C20 Malignant neoplasm of rectum: Secondary | ICD-10-CM | POA: Diagnosis not present

## 2020-06-07 DIAGNOSIS — C20 Malignant neoplasm of rectum: Secondary | ICD-10-CM | POA: Diagnosis not present

## 2020-06-07 DIAGNOSIS — Z510 Encounter for antineoplastic radiation therapy: Secondary | ICD-10-CM | POA: Diagnosis not present

## 2020-06-08 ENCOUNTER — Ambulatory Visit: Payer: Medicare Other

## 2020-06-09 ENCOUNTER — Ambulatory Visit
Admission: RE | Admit: 2020-06-09 | Discharge: 2020-06-09 | Disposition: A | Discharge status: Home / Self Care | Payer: Medicare Other | Source: Ambulatory Visit | Attending: Radiation Oncology | Admitting: Radiation Oncology

## 2020-06-09 ENCOUNTER — Ambulatory Visit: Payer: Medicare Other

## 2020-06-09 ENCOUNTER — Other Ambulatory Visit: Payer: Self-pay

## 2020-06-09 DIAGNOSIS — C20 Malignant neoplasm of rectum: Secondary | ICD-10-CM | POA: Diagnosis not present

## 2020-06-09 DIAGNOSIS — Z510 Encounter for antineoplastic radiation therapy: Secondary | ICD-10-CM | POA: Diagnosis not present

## 2020-06-10 ENCOUNTER — Ambulatory Visit: Payer: Medicare Other

## 2020-06-10 ENCOUNTER — Ambulatory Visit
Admission: RE | Admit: 2020-06-10 | Discharge: 2020-06-10 | Disposition: A | Discharge status: Home / Self Care | Payer: Medicare Other | Source: Ambulatory Visit | Attending: Radiation Oncology | Admitting: Radiation Oncology

## 2020-06-10 DIAGNOSIS — C61 Malignant neoplasm of prostate: Secondary | ICD-10-CM | POA: Diagnosis present

## 2020-06-10 DIAGNOSIS — C20 Malignant neoplasm of rectum: Secondary | ICD-10-CM | POA: Diagnosis not present

## 2020-06-10 DIAGNOSIS — Z510 Encounter for antineoplastic radiation therapy: Secondary | ICD-10-CM | POA: Insufficient documentation

## 2020-06-11 ENCOUNTER — Other Ambulatory Visit: Payer: Self-pay

## 2020-06-11 ENCOUNTER — Ambulatory Visit: Payer: Medicare Other

## 2020-06-11 ENCOUNTER — Ambulatory Visit
Admission: RE | Admit: 2020-06-11 | Discharge: 2020-06-11 | Disposition: A | Discharge status: Home / Self Care | Payer: Medicare Other | Source: Ambulatory Visit | Attending: Radiation Oncology | Admitting: Radiation Oncology

## 2020-06-11 DIAGNOSIS — Z192 Hormone resistant malignancy status: Secondary | ICD-10-CM | POA: Diagnosis not present

## 2020-06-11 DIAGNOSIS — R339 Retention of urine, unspecified: Secondary | ICD-10-CM | POA: Diagnosis not present

## 2020-06-11 DIAGNOSIS — Z510 Encounter for antineoplastic radiation therapy: Secondary | ICD-10-CM | POA: Diagnosis not present

## 2020-06-11 DIAGNOSIS — C20 Malignant neoplasm of rectum: Secondary | ICD-10-CM | POA: Diagnosis not present

## 2020-06-12 ENCOUNTER — Ambulatory Visit: Payer: Medicare Other

## 2020-06-12 ENCOUNTER — Ambulatory Visit
Admission: RE | Admit: 2020-06-12 | Discharge: 2020-06-12 | Disposition: A | Discharge status: Home / Self Care | Payer: Medicare Other | Source: Ambulatory Visit | Attending: Radiation Oncology | Admitting: Radiation Oncology

## 2020-06-12 DIAGNOSIS — Z510 Encounter for antineoplastic radiation therapy: Secondary | ICD-10-CM | POA: Diagnosis not present

## 2020-06-12 DIAGNOSIS — C20 Malignant neoplasm of rectum: Secondary | ICD-10-CM | POA: Diagnosis not present

## 2020-06-15 ENCOUNTER — Ambulatory Visit
Admission: RE | Admit: 2020-06-15 | Discharge: 2020-06-15 | Disposition: A | Discharge status: Home / Self Care | Payer: Medicare Other | Source: Ambulatory Visit | Attending: Radiation Oncology | Admitting: Radiation Oncology

## 2020-06-15 ENCOUNTER — Encounter: Payer: Self-pay | Admitting: Radiation Oncology

## 2020-06-15 ENCOUNTER — Other Ambulatory Visit: Payer: Self-pay

## 2020-06-15 DIAGNOSIS — Z510 Encounter for antineoplastic radiation therapy: Secondary | ICD-10-CM | POA: Diagnosis not present

## 2020-06-15 DIAGNOSIS — C20 Malignant neoplasm of rectum: Secondary | ICD-10-CM | POA: Diagnosis not present

## 2020-06-19 NOTE — Progress Notes (Signed)
                                                                                                                                                             Patient Name: Martin Foster MRN: [id number] DOB: 08-20-1925 Referring Physician: Leighton Ruff (Profile Not Attached) Date of Service: 06/15/2020 [facility], [place]                                                        End Of Treatment Note  Diagnoses: C20-Malignant neoplasm of rectum  Cancer Staging:  At least Stage III, cT3N2M0 adenocarcinoma of the rectum  Intent: Curative  Radiation Treatment Dates: 05/06/2020 through 06/15/2020 Site Technique Total Dose (Gy) Dose per Fx (Gy) Completed Fx Beam Energies  Rectum: Rectum IMRT 45/45 1.8 25/25 6X  Rectum: Rectum_Bst IMRT 5.4/5.4 1.8 3/3 6X   Narrative: The patient tolerated radiation therapy relatively well. He has an in situ foley catheter still and did not have any diarrhea during treatment.   Plan:The patient will receive a call in about one month from the radiation oncology department. He will continue follow up with Dr. Marcello Moores as well.   ________________________________________________    Carola Rhine, [facility]

## 2020-07-08 DIAGNOSIS — R339 Retention of urine, unspecified: Secondary | ICD-10-CM | POA: Diagnosis not present

## 2020-08-03 ENCOUNTER — Other Ambulatory Visit: Payer: Self-pay

## 2020-08-03 ENCOUNTER — Ambulatory Visit
Admission: RE | Admit: 2020-08-03 | Discharge: 2020-08-03 | Disposition: A | Discharge status: Home / Self Care | Payer: Medicare Other | Source: Ambulatory Visit | Attending: Radiation Oncology | Admitting: Radiation Oncology

## 2020-08-03 DIAGNOSIS — C20 Malignant neoplasm of rectum: Secondary | ICD-10-CM | POA: Insufficient documentation

## 2020-08-03 NOTE — Progress Notes (Signed)
  Radiation Oncology         [phone] ________________________________  Name: Martin Foster MRN: [id number]  Date of Service: 08/03/2020  DOB: [birth year]  Post Treatment Telephone Note  Diagnosis:   At least Stage III, cT3N2M0 adenocarcinoma of the rectum  Interval Since Last Radiation:  7 weeks   05/06/2020 through 06/15/2020 Site Technique Total Dose (Gy) Dose per Fx (Gy) Completed Fx Beam Energies  Rectum: Rectum IMRT 45/45 1.8 25/25 6X  Rectum: Rectum_Bst IMRT 5.4/5.4 1.8 3/3 6X    Narrative:  The patient was contacted today for routine follow-up. During treatment he did very well with radiotherapy and did not have significant desquamation. His catheter has been successfully removed which he had prior to radiation. This has been out for about a month and he follows up with urology with Dr. Louis Meckel in about a month.   Impression/Plan: 1. At least Stage III, cT3N2M0 adenocarcinoma of the rectum. The patient has been doing well since completion of radiotherapy. We discussed that we would be happy to continue to follow him as needed, but he will also continue to follow up with Dr. Marcello Moores in colorectal surgery.      Carola Rhine, PAC

## 2020-09-22 DIAGNOSIS — Z5111 Encounter for antineoplastic chemotherapy: Secondary | ICD-10-CM | POA: Diagnosis not present

## 2020-09-22 DIAGNOSIS — R339 Retention of urine, unspecified: Secondary | ICD-10-CM | POA: Diagnosis not present

## 2020-09-22 DIAGNOSIS — Z192 Hormone resistant malignancy status: Secondary | ICD-10-CM | POA: Diagnosis not present

## 2020-09-22 DIAGNOSIS — C775 Secondary and unspecified malignant neoplasm of intrapelvic lymph nodes: Secondary | ICD-10-CM | POA: Diagnosis not present

## 2020-09-22 DIAGNOSIS — M818 Other osteoporosis without current pathological fracture: Secondary | ICD-10-CM | POA: Diagnosis not present

## 2020-09-29 DIAGNOSIS — Z85820 Personal history of malignant melanoma of skin: Secondary | ICD-10-CM | POA: Diagnosis not present

## 2020-09-29 DIAGNOSIS — D2372 Other benign neoplasm of skin of left lower limb, including hip: Secondary | ICD-10-CM | POA: Diagnosis not present

## 2020-09-29 DIAGNOSIS — L821 Other seborrheic keratosis: Secondary | ICD-10-CM | POA: Diagnosis not present

## 2020-12-14 DIAGNOSIS — Z192 Hormone resistant malignancy status: Secondary | ICD-10-CM | POA: Diagnosis not present

## 2021-03-22 DIAGNOSIS — C775 Secondary and unspecified malignant neoplasm of intrapelvic lymph nodes: Secondary | ICD-10-CM | POA: Diagnosis not present

## 2021-03-30 ENCOUNTER — Other Ambulatory Visit: Payer: Self-pay

## 2021-03-30 ENCOUNTER — Encounter (HOSPITAL_COMMUNITY)
Admission: RE | Admit: 2021-03-30 | Discharge: 2021-03-30 | Disposition: A | Discharge status: Home / Self Care | Payer: Medicare Other | Source: Ambulatory Visit | Attending: Nurse Practitioner | Admitting: Nurse Practitioner

## 2021-03-30 DIAGNOSIS — C61 Malignant neoplasm of prostate: Secondary | ICD-10-CM | POA: Diagnosis present

## 2021-03-30 DIAGNOSIS — C7951 Secondary malignant neoplasm of bone: Secondary | ICD-10-CM | POA: Diagnosis not present

## 2021-03-30 DIAGNOSIS — I700 Atherosclerosis of aorta: Secondary | ICD-10-CM | POA: Diagnosis not present

## 2021-03-30 DIAGNOSIS — K7689 Other specified diseases of liver: Secondary | ICD-10-CM | POA: Diagnosis not present

## 2021-03-30 MED ORDER — PIFLIFOLASTAT F 18 (PYLARIFY) INJECTION
9.0000 | Freq: Once | INTRAVENOUS | Status: AC
  Administered 2021-03-30: 7.8 via INTRAVENOUS

## 2021-04-03 ENCOUNTER — Emergency Department (HOSPITAL_COMMUNITY): Payer: Medicare Other

## 2021-04-03 ENCOUNTER — Encounter (HOSPITAL_COMMUNITY): Payer: Self-pay | Admitting: Emergency Medicine

## 2021-04-03 ENCOUNTER — Emergency Department (HOSPITAL_COMMUNITY)
Admission: EM | Admit: 2021-04-03 | Discharge: 2021-04-03 | Disposition: A | Discharge status: Home / Self Care | Payer: Medicare Other | Attending: Emergency Medicine | Admitting: Emergency Medicine

## 2021-04-03 ENCOUNTER — Other Ambulatory Visit: Payer: Self-pay

## 2021-04-03 DIAGNOSIS — M545 Low back pain, unspecified: Secondary | ICD-10-CM | POA: Diagnosis present

## 2021-04-03 DIAGNOSIS — I700 Atherosclerosis of aorta: Secondary | ICD-10-CM | POA: Insufficient documentation

## 2021-04-03 DIAGNOSIS — M5459 Other low back pain: Secondary | ICD-10-CM | POA: Diagnosis not present

## 2021-04-03 DIAGNOSIS — R109 Unspecified abdominal pain: Secondary | ICD-10-CM | POA: Diagnosis not present

## 2021-04-03 LAB — CBC WITH DIFFERENTIAL/PLATELET
Abs Immature Granulocytes: 0.02 10*3/uL (ref 0.00–0.07)
Basophils Absolute: 0.1 10*3/uL (ref 0.0–0.1)
Basophils Relative: 1 %
Eosinophils Absolute: 0.3 10*3/uL (ref 0.0–0.5)
Eosinophils Relative: 4 %
HCT: 32.7 % — ABNORMAL LOW (ref 39.0–52.0)
Hemoglobin: 10.9 g/dL — ABNORMAL LOW (ref 13.0–17.0)
Immature Granulocytes: 0 %
Lymphocytes Relative: 12 %
Lymphs Abs: 1 10*3/uL (ref 0.7–4.0)
MCH: 31.3 pg (ref 26.0–34.0)
MCHC: 33.3 g/dL (ref 30.0–36.0)
MCV: 94 fL (ref 80.0–100.0)
Monocytes Absolute: 0.7 10*3/uL (ref 0.1–1.0)
Monocytes Relative: 8 %
Neutro Abs: 6.3 10*3/uL (ref 1.7–7.7)
Neutrophils Relative %: 75 %
Platelets: 260 10*3/uL (ref 150–400)
RBC: 3.48 MIL/uL — ABNORMAL LOW (ref 4.22–5.81)
RDW: 15.5 % (ref 11.5–15.5)
WBC: 8.4 10*3/uL (ref 4.0–10.5)
nRBC: 0 % (ref 0.0–0.2)

## 2021-04-03 LAB — COMPREHENSIVE METABOLIC PANEL
ALT: 13 U/L (ref 0–44)
AST: 18 U/L (ref 15–41)
Albumin: 3.3 g/dL — ABNORMAL LOW (ref 3.5–5.0)
Alkaline Phosphatase: 68 U/L (ref 38–126)
Anion gap: 9 (ref 5–15)
BUN: 29 mg/dL — ABNORMAL HIGH (ref 8–23)
CO2: 21 mmol/L — ABNORMAL LOW (ref 22–32)
Calcium: 8.9 mg/dL (ref 8.9–10.3)
Chloride: 106 mmol/L (ref 98–111)
Creatinine, Ser: 0.92 mg/dL (ref 0.61–1.24)
GFR, Estimated: >60 mL/min (ref >60)
Glucose, Bld: 124 mg/dL — ABNORMAL HIGH (ref 70–99)
Potassium: 4 mmol/L (ref 3.5–5.1)
Sodium: 136 mmol/L (ref 135–145)
Total Bilirubin: 0.5 mg/dL (ref 0.3–1.2)
Total Protein: 6.7 g/dL (ref 6.5–8.1)

## 2021-04-03 MED ORDER — OXYCODONE-ACETAMINOPHEN 5-325 MG PO TABS
1.0000 | ORAL_TABLET | Freq: Four times a day (QID) | ORAL | 0 refills | Status: DC | PRN
Start: 2021-04-03 — End: 2021-04-06

## 2021-04-03 MED ORDER — IOHEXOL 350 MG/ML SOLN
100.0000 mL | Freq: Once | INTRAVENOUS | Status: AC | PRN
  Administered 2021-04-03: 100 mL via INTRAVENOUS

## 2021-04-03 MED ORDER — HYDROMORPHONE HCL 1 MG/ML IJ SOLN
0.5000 mg | Freq: Once | INTRAMUSCULAR | Status: AC
  Administered 2021-04-03: 0.5 mg via INTRAVENOUS
  Filled 2021-04-03: qty 1

## 2021-04-03 MED ORDER — HYDROMORPHONE HCL 1 MG/ML IJ SOLN
1.0000 mg | Freq: Once | INTRAMUSCULAR | Status: AC
  Administered 2021-04-03: 1 mg via INTRAVENOUS
  Filled 2021-04-03: qty 1

## 2021-04-03 MED ORDER — KETOROLAC TROMETHAMINE 15 MG/ML IJ SOLN
15.0000 mg | Freq: Once | INTRAMUSCULAR | Status: AC
  Administered 2021-04-03: 15 mg via INTRAVENOUS
  Filled 2021-04-03: qty 1

## 2021-04-03 MED ORDER — BISACODYL 5 MG PO TBEC: 5.0000 mg | DELAYED_RELEASE_TABLET | Freq: Every day | ORAL | 0 refills | Status: AC

## 2021-04-03 MED ORDER — MORPHINE SULFATE (PF) 4 MG/ML IV SOLN
4.0000 mg | Freq: Once | INTRAVENOUS | Status: AC
  Administered 2021-04-03: 4 mg via INTRAVENOUS
  Filled 2021-04-03: qty 1

## 2021-04-03 NOTE — ED Provider Notes (Signed)
?[facility] DEPT ?Provider Note ? ? ?CSN: [id number] ?Arrival date & time: 04/03/21  1824 ? ?  ? ?History ? ?Chief Complaint  ?Patient presents with  ? Back Pain  ? ? ?Martin Foster is a 88 y.o. male. ? ?Patient presents chief complaint of left-sided lower back pain described as sharp and aching radiates to the left flank.  Patient states that he spoke with his primary care doctor was given oxycodone which he has been taking without improvement of pain.  Denies any fevers or cough or vomiting or nausea denies any new numbness or weakness.  Denies any fall. ? ? ?  ? ?Home Medications ?Prior to Admission medications   ?Medication Sig Start Date End Date Taking? Authorizing Provider  ?bisacodyl (DULCOLAX) 5 MG EC tablet Take 1 tablet (5 mg total) by mouth daily for 7 days. 04/03/21 04/10/21 Yes Luna Fuse, MD  ?oxyCODONE-acetaminophen (PERCOCET/ROXICET) 5-325 MG tablet Take 1 tablet by mouth every 6 (six) hours as needed for severe pain. 04/03/21  Yes Luna Fuse, MD  ?abiraterone acetate (ZYTIGA) 250 MG tablet Take 1,000 mg by mouth daily. 03/03/20   [provider]  ?Ascorbic Acid (VITAMIN C) 500 MG CHEW 1 tablet    [provider]  ?capecitabine (XELODA) 500 MG tablet Take 3 tablets (1,500 mg total) by mouth 2 (two) times daily after a meal. 04/19/20   Truitt Merle, MD  ?cholecalciferol (VITAMIN D3) 25 MCG (1000 UNIT) tablet 1 tablet    [provider]  ?cyanocobalamin 100 MCG tablet Take by mouth.    [provider]  ?denosumab (PROLIA) 60 MG/ML SOSY injection 60 mg    [provider]  ?ferrous sulfate 325 (65 FE) MG tablet Take 325 mg by mouth daily. 01/06/20   [provider]  ?leuprolide (LUPRON DEPOT, 31-MONTH,) 3.75 MG injection     [provider]  ?losartan (COZAAR) 100 MG tablet Take 100 mg by mouth daily. 03/14/20   [provider]  ?Misc Natural Products (OSTEO BI-FLEX ADV DOUBLE ST PO) 1 tablet    [provider]  ?Multiple Vitamins-Minerals (MULTIVITAMIN ADULT EXTRA C PO) 1 tablet    [provider]  ?predniSONE 5 MG/ML concentrated solution 1 ml    [provider]  ?Tamsulosin HCl (FLOMAX) 0.4 MG CAPS Take 0.4 mg by mouth daily.    [provider]  ?vitamin E 180 MG (400 UNITS) capsule 1 capsule    [provider]  ?   ? ?Allergies    ?Lisinopril, Olmesartan medoxomil-hctz, Ampicillin, Ciprofibrate, Ciprofloxacin, Erythromycin, Fd&c yellow #6 [yellow dye], and Other   ? ?Review of Systems   ?Review of Systems  ?Constitutional:  Negative for fever.  ?HENT:  Negative for ear pain and sore throat.   ?Eyes:  Negative for pain.  ?Respiratory:  Negative for cough.   ?Cardiovascular:  Negative for chest pain.  ?Gastrointestinal:  Negative for abdominal pain.  ?Genitourinary:  Negative for flank pain.  ?Musculoskeletal:  Positive for back pain.  ?Skin:  Negative for color change and rash.  ?Neurological:  Negative for syncope.  ?All other systems reviewed and are negative. ? ?Physical Exam ?Updated Vital Signs ?BP (!) 155/68   Pulse 71   Temp 98.4 ?F (36.9 ?C) (Oral)   Resp 16   Ht '5\' 5"'$  (1.651 m)   Wt 70 kg   SpO2 97%   BMI 25.68 kg/m?  ?Physical Exam ?Constitutional:   ?   Appearance: He  is well-developed.  ?HENT:  ?   Head: Normocephalic.  ?   Nose: Nose normal.  ?Eyes:  ?   Extraocular Movements: Extraocular movements intact.  ?Cardiovascular:  ?   Rate and Rhythm: Normal rate.  ?Pulmonary:  ?   Effort: Pulmonary effort is normal.  ?Skin: ?   Coloration: Skin is not jaundiced.  ?Neurological:  ?   Mental Status: He is alert. Mental status is at baseline.  ?   Comments: 5/5 strength all extremities.  No pain with range of motion of bilateral hips knees or ankles.  No C or T or L-spine midline step-offs or tenderness noted on exam.  ? ? ?ED Results / Procedures / Treatments   ?Labs ?(all labs ordered are listed, but only abnormal results are displayed) ?Labs Reviewed   ?CBC WITH DIFFERENTIAL/PLATELET - Abnormal; Notable for the following components:  ?    Result Value  ? RBC 3.48 (*)   ? Hemoglobin 10.9 (*)   ? HCT 32.7 (*)   ? All other components within normal limits  ?COMPREHENSIVE METABOLIC PANEL - Abnormal; Notable for the following components:  ? CO2 21 (*)   ? Glucose, Bld 124 (*)   ? BUN 29 (*)   ? Albumin 3.3 (*)   ? All other components within normal limits  ? ? ?EKG ?None ? ?Radiology ?CT Angio Abd/Pel W and/or Wo Contrast ? ?Result Date: 04/03/2021 ?CLINICAL DATA:  Left-sided back pain that wraps around the left abdomen. EXAM: CTA ABDOMEN AND PELVIS WITHOUT AND WITH CONTRAST TECHNIQUE: Multidetector CT imaging of the abdomen and pelvis was performed using the standard protocol during bolus administration of intravenous contrast. Multiplanar reconstructed images and MIPs were obtained and reviewed to evaluate the vascular anatomy. RADIATION DOSE REDUCTION: This exam was performed according to the departmental dose-optimization program which includes automated exposure control, adjustment of the mA and/or kV according to patient size and/or use of iterative reconstruction technique. CONTRAST:  114m OMNIPAQUE IOHEXOL 350 MG/ML SOLN COMPARISON:  March 05, 2020 FINDINGS: VASCULAR Aorta: Marked severity calcification and atherosclerosis of a normal caliber aorta without aneurysm, dissection, vasculitis or significant stenosis. Celiac: Patent without evidence of aneurysm, dissection, vasculitis or significant stenosis. SMA: Patent without evidence of aneurysm, dissection, vasculitis or significant stenosis. Renals: Both renal arteries are patent without evidence of aneurysm, dissection, vasculitis, fibromuscular dysplasia or significant stenosis. IMA: Patent without evidence of aneurysm, dissection, vasculitis or significant stenosis. Inflow: Marked severity calcification and atherosclerosis without evidence of aneurysm, dissection, vasculitis or significant stenosis.  Proximal Outflow: Bilateral common femoral and visualized portions of the superficial and profunda femoral arteries are patent without evidence of aneurysm, dissection, vasculitis or significant stenosis. Veins: No obvious venous abnormality within the limitations of this arterial phase study. Review of the MIP images confirms the above findings. NON-VASCULAR Lower chest: No acute abnormality. Hepatobiliary: No focal liver abnormality is seen. No gallstones, gallbladder wall thickening, or biliary dilatation. Pancreas: Unremarkable. No pancreatic ductal dilatation or surrounding inflammatory changes. Spleen: Normal in size without focal abnormality. Adrenals/Urinary Tract: Adrenal glands are unremarkable. Kidneys are normal, without renal calculi, focal lesion, or hydronephrosis. There is mild diffuse urinary bladder wall thickening. Stomach/Bowel: There is a small hiatal hernia. The appendix is surgically absent. No evidence of bowel wall thickening, distention, or inflammatory changes. Lymphatic: No abnormal abdominal or pelvic lymph nodes are identified. Reproductive: The prostate gland is moderately enlarged. Other: No abdominal wall hernia or abnormality. No abdominopelvic ascites. Musculoskeletal: No acute or significant osseous findings. IMPRESSION:  1. Diffuse atherosclerotic disease without aneurysmal dilatation, major vessel occlusion or hemodynamically significant stenosis. 2. Small hiatal hernia. 3. Mild diffuse urinary bladder wall thickening which may be secondary to chronic outlet obstruction versus cystitis. Correlation with urinalysis is recommended. 4. Prostatomegaly. Aortic Atherosclerosis (ICD10-I70.0). Electronically Signed   By: Virgina Norfolk M.D.   On: 04/03/2021 20:47   ? ?Procedures ?Procedures  ? ? ?Medications Ordered in ED ?Medications  ?morphine (PF) 4 MG/ML injection 4 mg (4 mg Intravenous Given 04/03/21 1919)  ?HYDROmorphone (DILAUDID) injection 0.5 mg (0.5 mg Intravenous Given  04/03/21 2002)  ?iohexol (OMNIPAQUE) 350 MG/ML injection 100 mL (100 mLs Intravenous Contrast Given 04/03/21 2010)  ?HYDROmorphone (DILAUDID) injection 1 mg (1 mg Intravenous Given 04/03/21 2121)  ?ketorolac (TORADOL) 15 MG

## 2021-04-03 NOTE — Discharge Instructions (Signed)
Call your primary care doctor or specialist as discussed in the next 2-3 days.   Return immediately back to the ER if:  Your symptoms worsen within the next 12-24 hours. You develop new symptoms such as new fevers, persistent vomiting, new pain, shortness of breath, or new weakness or numbness, or if you have any other concerns.  

## 2021-04-03 NOTE — ED Triage Notes (Signed)
Patient reports L back pain that wraps around to his L abdominal area. Picked up his oxycodone last night, has taken 3 pills, has not touched the pain. Denies N/V.  ?

## 2021-04-05 ENCOUNTER — Ambulatory Visit
Admission: RE | Admit: 2021-04-05 | Discharge: 2021-04-05 | Disposition: A | Discharge status: Home / Self Care | Payer: Medicare Other | Source: Ambulatory Visit | Attending: Radiation Oncology | Admitting: Radiation Oncology

## 2021-04-05 ENCOUNTER — Telehealth: Payer: Self-pay | Admitting: Radiation Oncology

## 2021-04-05 VITALS — Ht 65.0 in | Wt 145.0 lb

## 2021-04-05 DIAGNOSIS — R739 Hyperglycemia, unspecified: Secondary | ICD-10-CM | POA: Insufficient documentation

## 2021-04-05 DIAGNOSIS — E785 Hyperlipidemia, unspecified: Secondary | ICD-10-CM | POA: Insufficient documentation

## 2021-04-05 DIAGNOSIS — D649 Anemia, unspecified: Secondary | ICD-10-CM | POA: Insufficient documentation

## 2021-04-05 DIAGNOSIS — C439 Malignant melanoma of skin, unspecified: Secondary | ICD-10-CM | POA: Insufficient documentation

## 2021-04-05 DIAGNOSIS — E559 Vitamin D deficiency, unspecified: Secondary | ICD-10-CM | POA: Insufficient documentation

## 2021-04-05 DIAGNOSIS — H905 Unspecified sensorineural hearing loss: Secondary | ICD-10-CM | POA: Insufficient documentation

## 2021-04-05 DIAGNOSIS — E8881 Metabolic syndrome: Secondary | ICD-10-CM | POA: Insufficient documentation

## 2021-04-05 DIAGNOSIS — R7303 Prediabetes: Secondary | ICD-10-CM | POA: Insufficient documentation

## 2021-04-05 DIAGNOSIS — C61 Malignant neoplasm of prostate: Secondary | ICD-10-CM

## 2021-04-05 DIAGNOSIS — C7949 Secondary malignant neoplasm of other parts of nervous system: Secondary | ICD-10-CM | POA: Insufficient documentation

## 2021-04-05 DIAGNOSIS — N402 Nodular prostate without lower urinary tract symptoms: Secondary | ICD-10-CM | POA: Insufficient documentation

## 2021-04-05 DIAGNOSIS — L989 Disorder of the skin and subcutaneous tissue, unspecified: Secondary | ICD-10-CM | POA: Insufficient documentation

## 2021-04-05 DIAGNOSIS — M47816 Spondylosis without myelopathy or radiculopathy, lumbar region: Secondary | ICD-10-CM | POA: Insufficient documentation

## 2021-04-05 DIAGNOSIS — C7951 Secondary malignant neoplasm of bone: Secondary | ICD-10-CM | POA: Diagnosis not present

## 2021-04-05 DIAGNOSIS — C20 Malignant neoplasm of rectum: Secondary | ICD-10-CM | POA: Diagnosis not present

## 2021-04-05 DIAGNOSIS — Z192 Hormone resistant malignancy status: Secondary | ICD-10-CM | POA: Diagnosis not present

## 2021-04-05 DIAGNOSIS — D692 Other nonthrombocytopenic purpura: Secondary | ICD-10-CM | POA: Insufficient documentation

## 2021-04-05 DIAGNOSIS — M545 Low back pain, unspecified: Secondary | ICD-10-CM | POA: Insufficient documentation

## 2021-04-05 DIAGNOSIS — K625 Hemorrhage of anus and rectum: Secondary | ICD-10-CM | POA: Insufficient documentation

## 2021-04-05 NOTE — Progress Notes (Signed)
Histology and Location of Primary Cancer: Rectal cancer ? ?Associate Dx:  Prostate Cancer ? ?Sites of Visceral and Bony Metastatic Disease: Hepatic metastasis in the left hepatic lobe. Lesion T7 ? ?Location(s) of Symptomatic Metastases: T7 ? ?  ?IMPRESSION: ?1. Dominant finding is multifocal intense radiotracer avid prostate ?cancer skeletal metastasis. Multiple levels of involvement of ?thoracic spine. Lesion at T7 is expansile and encroaches upon the ?central canal. Recommend clinical correlation for neurologic ?symptoms. ?2. Radiotracer avid prostate cancer HEPATIC METASTASIS in the LEFT ?hepatic lobe. ?3. Solitary nodal activity in the RIGHT axilla may relate to RIGHT ?antecubital injection (benign). ?4. Rresolution pelvic adenopathy present on comparison CT. ? ? ?Past/Anticipated chemotherapy by medical oncology, if any:  ? ?Pain on a scale of 0-10 is:  8/10 ? ? ?If Spine Met(s), symptoms, if any, include: ?Bowel/Bladder retention or incontinence (please describe): No  ?Numbness or weakness in extremities (please describe): No numbness or weakness ?Current Decadron regimen, if applicable: No ? ?Ambulatory status? Walker? Wheelchair?: None at this time. ? ?SAFETY ISSUES: ?Prior radiation? Yes, 04/2020 Colorectal Cancer, 03/2020 Prostate Cancer ?Pacemaker/ICD? No ?Possible current pregnancy?  Male ?Is the patient on methotrexate? No ? ?Current Complaints / other details:   ? ? ?

## 2021-04-05 NOTE — Progress Notes (Signed)
?Radiation Oncology         [phone] ?________________________________ ? ?Name: Martin Foster MRN: [id number]  ?Date: 04/05/2021  DOB: Jun 17, 1925 ? ?Follow-Up Visit Note ? ?Conducted via telephone due to current COVID-19 concerns for limiting patient exposure ? ?CC: [name], MD  Ardis Hughs, MD ? ?Diagnosis:   88 yo man at risk for spinal cord compression at T7 from metastatic castration resistant prostate cancer ? ?  ICD-10-CM   ?1. 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  ?2. Adenocarcinoma of prostate ([place])  C61   ?  ? ? ?Interval Since Last Radiation:  9 months ? ?Narrative:  The patient returns today regarding severe back pain and T7 spinal met seen on PSMA-PET scan.                      ? ?ALLERGIES:  is allergic to lisinopril, olmesartan medoxomil-hctz, ampicillin, ciprofibrate, ciprofloxacin, erythromycin, fd&c yellow #6 [yellow dye], and other. ? ?Meds: ?Current Outpatient Medications  ?Medication Sig Dispense Refill  ? abiraterone acetate (ZYTIGA) 250 MG tablet Take 1,000 mg by mouth daily.    ? Ascorbic Acid (VITAMIN C) 500 MG CHEW 1 tablet    ? bisacodyl (DULCOLAX) 5 MG EC tablet Take 1 tablet (5 mg total) by mouth daily for 7 days. 7 tablet 0  ? capecitabine (XELODA) 500 MG tablet Take 3 tablets (1,500 mg total) by mouth 2 (two) times daily after a meal. 90 tablet 1  ? cholecalciferol (VITAMIN D3) 25 MCG (1000 UNIT) tablet 1 tablet    ? cyanocobalamin 100 MCG tablet Take by mouth.    ? denosumab (PROLIA) 60 MG/ML SOSY injection 60 mg    ? ferrous sulfate 325 (65 FE) MG tablet Take 325 mg by mouth daily.    ? leuprolide (LUPRON DEPOT, 8-MONTH,) 3.75 MG injection     ? losartan (COZAAR) 100 MG tablet Take 100 mg by mouth daily.    ? Misc Natural Products (OSTEO BI-FLEX ADV DOUBLE ST PO) 1 tablet    ? Multiple Vitamins-Minerals (MULTIVITAMIN ADULT EXTRA C PO) 1 tablet    ? oxyCODONE (OXY IR/ROXICODONE) 5 MG immediate release tablet Take 5-10 mg by mouth every 6 (six)  hours.    ? oxyCODONE-acetaminophen (PERCOCET/ROXICET) 5-325 MG tablet Take 1 tablet by mouth every 6 (six) hours as needed for severe pain. 15 tablet 0  ? predniSONE 5 MG/ML concentrated solution 1 ml    ? Tamsulosin HCl (FLOMAX) 0.4 MG CAPS Take 0.4 mg by mouth daily.    ? vitamin E 180 MG (400 UNITS) capsule 1 capsule    ? ?No current facility-administered medications for this encounter.  ? ? ?Physical Findings: ?The patient is in no acute distress. Patient is alert and oriented. ? height is 5' 5" (1.651 m) and weight is 145 lb (65.8 kg). Marland Kitchen   ?Patient reported to be neuro intact with 5/5 MS in LEs and good B/B function, no numbness ? ?Lab Findings: ?Lab Results  ?Component Value Date  ? WBC 8.4 04/03/2021  ? HGB 10.9 (L) 04/03/2021  ? HCT 32.7 (L) 04/03/2021  ? PLT 260 04/03/2021  ? ? ?Lab Results  ?Component Value Date  ? NA 136 04/03/2021  ? K 4.0 04/03/2021  ? CO2 21 (L) 04/03/2021  ? GLUCOSE 124 (H) 04/03/2021  ? BUN 29 (H) 04/03/2021  ? CREATININE 0.92 04/03/2021  ? BILITOT 0.5 04/03/2021  ? ALKPHOS 68 04/03/2021  ?  AST 18 04/03/2021  ? ALT 13 04/03/2021  ? PROT 6.7 04/03/2021  ? ALBUMIN 3.3 (L) 04/03/2021  ? CALCIUM 8.9 04/03/2021  ? ANIONGAP 9 04/03/2021  ? ? ?Radiographic Findings: ?NM PET (F18-PYLARIFY) SKULL TO MID THIGH ? ?Result Date: 04/01/2021 ?CLINICAL DATA:  Prostate cancer. Additional history of rectal carcinoma. EXAM: NUCLEAR MEDICINE PET SKULL BASE TO THIGH TECHNIQUE: 7.8 mCi F18 Piflufolastat (Pylarify) was injected intravenously. Full-ring PET imaging was performed from the skull base to thigh after the radiotracer. CT data was obtained and used for attenuation correction and anatomic localization. COMPARISON:  CT 03/05/2020 FINDINGS: NECK No radiotracer activity in neck lymph nodes. Incidental CT finding: None CHEST No radiotracer accumulation within mediastinal or hilar lymph nodes. Intense radiotracer activity associated with solitary RIGHT axillary lymph node with SUV max equal 12.7 on  image 77. Normal size. Incidental CT finding: None ABDOMEN/PELVIS Prostate: No focal activity in prostatectomy bed. Lymph nodes: No abnormal radiotracer accumulation within pelvic or abdominal nodes. No enlarged lymph nodes are evident in the pelvis or abdomen. Resolution adenopathy seen on CT 03/05/2020 Liver: Intensely radiotracer avid lesion in the LEFT hepatic lobe SUV max equal 32.7. Subtle hypodensity noted on the noncontrast CT measuring 19 mm on image 127/series 4) Incidental CT finding: Atherosclerotic calcification of the aorta. SKELETON Multifocal radiotracer avid skeletal metastasis. Lytic expansile lesion at the T7 vertebral body extends into the posterior elements has intense metabolic activity SUV max equal 20.2. This lesion extends into the central canal measuring 2.8 x 3.2 cm (image 93/4). Intense radiotracer avid lesions at T2, T6, T11 and T12. Example lesion at T12 with SUV max equal 17.9. Intense lesion in the sternum with SUV max equal 20.4. RIGHT lateral rib lesion additionally. No metastatic lesions in the pelvis or lumbar spine presumably related to prior radiation therapy. IMPRESSION: 1. Dominant finding is multifocal intense radiotracer avid prostate cancer skeletal metastasis. Multiple levels of involvement of thoracic spine. Lesion at T7 is expansile and encroaches upon the central canal. Recommend clinical correlation for neurologic symptoms. 2. Radiotracer avid prostate cancer HEPATIC METASTASIS in the LEFT hepatic lobe. 3. Solitary nodal activity in the RIGHT axilla may relate to RIGHT antecubital injection (benign). 4. Rresolution pelvic adenopathy present on comparison CT. Electronically Signed   By: Suzy Bouchard M.D.   On: 04/01/2021 10:10  ? ?CT Angio Abd/Pel W and/or Wo Contrast ? ?Result Date: 04/03/2021 ?CLINICAL DATA:  Left-sided back pain that wraps around the left abdomen. EXAM: CTA ABDOMEN AND PELVIS WITHOUT AND WITH CONTRAST TECHNIQUE: Multidetector CT imaging of the  abdomen and pelvis was performed using the standard protocol during bolus administration of intravenous contrast. Multiplanar reconstructed images and MIPs were obtained and reviewed to evaluate the vascular anatomy. RADIATION DOSE REDUCTION: This exam was performed according to the departmental dose-optimization program which includes automated exposure control, adjustment of the mA and/or kV according to patient size and/or use of iterative reconstruction technique. CONTRAST:  149m OMNIPAQUE IOHEXOL 350 MG/ML SOLN COMPARISON:  March 05, 2020 FINDINGS: VASCULAR Aorta: Marked severity calcification and atherosclerosis of a normal caliber aorta without aneurysm, dissection, vasculitis or significant stenosis. Celiac: Patent without evidence of aneurysm, dissection, vasculitis or significant stenosis. SMA: Patent without evidence of aneurysm, dissection, vasculitis or significant stenosis. Renals: Both renal arteries are patent without evidence of aneurysm, dissection, vasculitis, fibromuscular dysplasia or significant stenosis. IMA: Patent without evidence of aneurysm, dissection, vasculitis or significant stenosis. Inflow: Marked severity calcification and atherosclerosis without evidence of aneurysm, dissection, vasculitis or significant stenosis.  Proximal Outflow: Bilateral common femoral and visualized portions of the superficial and profunda femoral arteries are patent without evidence of aneurysm, dissection, vasculitis or significant stenosis. Veins: No obvious venous abnormality within the limitations of this arterial phase study. Review of the MIP images confirms the above findings. NON-VASCULAR Lower chest: No acute abnormality. Hepatobiliary: No focal liver abnormality is seen. No gallstones, gallbladder wall thickening, or biliary dilatation. Pancreas: Unremarkable. No pancreatic ductal dilatation or surrounding inflammatory changes. Spleen: Normal in size without focal abnormality. Adrenals/Urinary  Tract: Adrenal glands are unremarkable. Kidneys are normal, without renal calculi, focal lesion, or hydronephrosis. There is mild diffuse urinary bladder wall thickening. Stomach/Bowel: There is a small hiata

## 2021-04-05 NOTE — Telephone Encounter (Signed)
Unable to LVM, phone just kept ringing. ?

## 2021-04-06 ENCOUNTER — Other Ambulatory Visit: Payer: Self-pay

## 2021-04-06 ENCOUNTER — Other Ambulatory Visit: Payer: Self-pay | Admitting: Urology

## 2021-04-06 ENCOUNTER — Ambulatory Visit
Admission: RE | Admit: 2021-04-06 | Discharge: 2021-04-06 | Disposition: A | Discharge status: Home / Self Care | Payer: Medicare Other | Source: Ambulatory Visit | Attending: Radiation Oncology | Admitting: Radiation Oncology

## 2021-04-06 DIAGNOSIS — Z510 Encounter for antineoplastic radiation therapy: Secondary | ICD-10-CM | POA: Insufficient documentation

## 2021-04-06 DIAGNOSIS — C61 Malignant neoplasm of prostate: Secondary | ICD-10-CM | POA: Insufficient documentation

## 2021-04-06 DIAGNOSIS — Z192 Hormone resistant malignancy status: Secondary | ICD-10-CM | POA: Diagnosis not present

## 2021-04-06 DIAGNOSIS — C7951 Secondary malignant neoplasm of bone: Secondary | ICD-10-CM | POA: Diagnosis not present

## 2021-04-06 DIAGNOSIS — C7949 Secondary malignant neoplasm of other parts of nervous system: Secondary | ICD-10-CM

## 2021-04-06 MED ORDER — MORPHINE SULFATE 15 MG PO TABS: 15.0000 mg | ORAL_TABLET | ORAL | 0 refills | Status: DC | PRN

## 2021-04-06 NOTE — Progress Notes (Signed)
?  Radiation Oncology         [phone] ?________________________________ ? ?Name: Martin Foster MRN: [id number]  ?Date: 04/06/2021  DOB: 10-Aug-1925 ? ?SIMULATION AND TREATMENT PLANNING NOTE ? ?  ICD-10-CM   ?1. Adenocarcinoma of prostate ([place])  C61   ?  ?2. Metastasis of neoplasm to spinal canal [facility])  C79.49   ?  ? ? ?DIAGNOSIS:  88 yo man at risk for spinal cord compression at T7 from metastatic castration resistant prostate cancer ? ?NARRATIVE:  The patient was brought to the [facility].  Identity was confirmed.  All relevant records and images related to the planned course of therapy were reviewed.  The patient freely provided informed written consent to proceed with treatment after reviewing the details related to the planned course of therapy. The consent form was witnessed and verified by the simulation staff.  Then, the patient was set-up in a stable reproducible  supine position for radiation therapy.  CT images were obtained.  Surface markings were placed.  The CT images were loaded into the planning software.  Then the target and avoidance structures were contoured.  Treatment planning then occurred.  The radiation prescription was entered and confirmed.  Then, I designed and supervised the construction of a total of at least 3 medically necessary complex treatment devices.  These include body positioner and MLCs to shield the heart and lungs maximally.  The number and details of the complex devices are clearly documented in the radiation oncology planning note and approved plan in [facility].  I have requested : 3D Simulation  I have requested a DVH of the following structures: left lung, right lung, heart, esophagus and spinal cord. ? ?PLAN:  The patient will receive 20 Gy in 5 fractions. ? ?________________________________ ? ?Sheral Apley Tammi Klippel, M.D. ? ?

## 2021-04-07 ENCOUNTER — Ambulatory Visit: Admission: RE | Admit: 2021-04-07 | Payer: Medicare Other | Source: Ambulatory Visit | Admitting: Radiation Oncology

## 2021-04-07 DIAGNOSIS — C7951 Secondary malignant neoplasm of bone: Secondary | ICD-10-CM | POA: Diagnosis not present

## 2021-04-07 DIAGNOSIS — Z192 Hormone resistant malignancy status: Secondary | ICD-10-CM | POA: Diagnosis not present

## 2021-04-07 DIAGNOSIS — Z510 Encounter for antineoplastic radiation therapy: Secondary | ICD-10-CM | POA: Diagnosis not present

## 2021-04-08 ENCOUNTER — Other Ambulatory Visit: Payer: Self-pay

## 2021-04-08 ENCOUNTER — Ambulatory Visit
Admission: RE | Admit: 2021-04-08 | Discharge: 2021-04-08 | Disposition: A | Discharge status: Home / Self Care | Payer: Medicare Other | Source: Ambulatory Visit | Attending: Radiation Oncology | Admitting: Radiation Oncology

## 2021-04-08 DIAGNOSIS — C7951 Secondary malignant neoplasm of bone: Secondary | ICD-10-CM | POA: Diagnosis not present

## 2021-04-08 DIAGNOSIS — Z192 Hormone resistant malignancy status: Secondary | ICD-10-CM | POA: Diagnosis not present

## 2021-04-08 DIAGNOSIS — Z510 Encounter for antineoplastic radiation therapy: Secondary | ICD-10-CM | POA: Diagnosis not present

## 2021-04-09 ENCOUNTER — Ambulatory Visit
Admission: RE | Admit: 2021-04-09 | Discharge: 2021-04-09 | Disposition: A | Discharge status: Home / Self Care | Payer: Medicare Other | Source: Ambulatory Visit | Attending: Radiation Oncology | Admitting: Radiation Oncology

## 2021-04-09 DIAGNOSIS — Z510 Encounter for antineoplastic radiation therapy: Secondary | ICD-10-CM | POA: Diagnosis not present

## 2021-04-09 DIAGNOSIS — C7951 Secondary malignant neoplasm of bone: Secondary | ICD-10-CM | POA: Diagnosis not present

## 2021-04-09 DIAGNOSIS — Z192 Hormone resistant malignancy status: Secondary | ICD-10-CM | POA: Diagnosis not present

## 2021-04-12 ENCOUNTER — Ambulatory Visit
Admission: RE | Admit: 2021-04-12 | Discharge: 2021-04-12 | Disposition: A | Discharge status: Home / Self Care | Payer: Medicare Other | Source: Ambulatory Visit | Attending: Radiation Oncology | Admitting: Radiation Oncology

## 2021-04-12 ENCOUNTER — Telehealth: Payer: Self-pay

## 2021-04-12 ENCOUNTER — Other Ambulatory Visit: Payer: Self-pay

## 2021-04-12 DIAGNOSIS — C61 Malignant neoplasm of prostate: Secondary | ICD-10-CM | POA: Insufficient documentation

## 2021-04-12 DIAGNOSIS — C7951 Secondary malignant neoplasm of bone: Secondary | ICD-10-CM | POA: Diagnosis not present

## 2021-04-12 DIAGNOSIS — Z192 Hormone resistant malignancy status: Secondary | ICD-10-CM | POA: Diagnosis not present

## 2021-04-12 DIAGNOSIS — Z510 Encounter for antineoplastic radiation therapy: Secondary | ICD-10-CM | POA: Insufficient documentation

## 2021-04-12 NOTE — Telephone Encounter (Signed)
Patient came over to nursing with complaints of abdomen discomfort that radiate to ribs around to back.  Patient is on pain medication and iron supplements that will cause constipation.Reports hasn't had BM x2 days and poor hydration, poor mobility per wife. Advised to increase fluid intake and take laxative/stool softener more often due to pain medication and lack of mobility.  Dr. Tammi Klippel made aware and saw patient. ?

## 2021-04-13 ENCOUNTER — Ambulatory Visit: Payer: Medicare Other

## 2021-04-13 ENCOUNTER — Ambulatory Visit
Admission: RE | Admit: 2021-04-13 | Discharge: 2021-04-13 | Disposition: A | Discharge status: Home / Self Care | Payer: Medicare Other | Source: Ambulatory Visit | Attending: Radiation Oncology | Admitting: Radiation Oncology

## 2021-04-13 DIAGNOSIS — Z192 Hormone resistant malignancy status: Secondary | ICD-10-CM | POA: Diagnosis not present

## 2021-04-13 DIAGNOSIS — C7951 Secondary malignant neoplasm of bone: Secondary | ICD-10-CM | POA: Diagnosis not present

## 2021-04-13 DIAGNOSIS — Z510 Encounter for antineoplastic radiation therapy: Secondary | ICD-10-CM | POA: Diagnosis not present

## 2021-04-14 ENCOUNTER — Encounter: Payer: Self-pay | Admitting: Urology

## 2021-04-14 ENCOUNTER — Ambulatory Visit
Admission: RE | Admit: 2021-04-14 | Discharge: 2021-04-14 | Disposition: A | Discharge status: Home / Self Care | Payer: Medicare Other | Source: Ambulatory Visit | Attending: Radiation Oncology | Admitting: Radiation Oncology

## 2021-04-14 ENCOUNTER — Other Ambulatory Visit: Payer: Self-pay

## 2021-04-14 DIAGNOSIS — C7951 Secondary malignant neoplasm of bone: Secondary | ICD-10-CM | POA: Diagnosis not present

## 2021-04-14 DIAGNOSIS — C61 Malignant neoplasm of prostate: Secondary | ICD-10-CM

## 2021-04-14 DIAGNOSIS — Z192 Hormone resistant malignancy status: Secondary | ICD-10-CM | POA: Diagnosis not present

## 2021-04-14 DIAGNOSIS — Z510 Encounter for antineoplastic radiation therapy: Secondary | ICD-10-CM | POA: Diagnosis not present

## 2021-04-14 DIAGNOSIS — C7949 Secondary malignant neoplasm of other parts of nervous system: Secondary | ICD-10-CM

## 2021-05-12 ENCOUNTER — Telehealth: Payer: Self-pay

## 2021-05-12 ENCOUNTER — Encounter: Payer: Self-pay | Admitting: Urology

## 2021-05-12 NOTE — Telephone Encounter (Signed)
Telephone appointment reminder. I left a voicemail reminding patient of his 8:30am-05/13/21 telephone appointment w/ Ashlyn Bruning PA-C. I left my extension [phone] and requested that patient return my call so that I may complete the nursing portion of this appointment. ?

## 2021-05-12 NOTE — Progress Notes (Signed)
Telephone appointment. I spoke w/ patient's spouse Martin Foster, verified her identity and began nursing interview. She reports that Martin Foster is having dorsalgia and overall joint pain 8/10, managed w/ Oxycodone '5mg'$ . No other issues reported at this time. ? ?Meaningful use complete. ?I-PSS score of 2 (mild). ?Flomax 0.'4mg'$  as directed. ?Urology appointment May 11th, 2023- per patient. ? ?Reminded patient's spouse of his 8:30am-05/13/21 telephone appointment w/ Ashlyn Bruning PA-C. I left my extension [phone] in case patient needs anything. Spouse verbalized understanding of the conversation. ? ?Patient contact- [phone] ?

## 2021-05-13 ENCOUNTER — Other Ambulatory Visit: Payer: Self-pay

## 2021-05-13 ENCOUNTER — Ambulatory Visit
Admission: RE | Admit: 2021-05-13 | Discharge: 2021-05-13 | Disposition: A | Discharge status: Home / Self Care | Payer: Medicare Other | Source: Ambulatory Visit | Attending: Urology | Admitting: Urology

## 2021-05-13 ENCOUNTER — Emergency Department (HOSPITAL_COMMUNITY): Payer: Medicare Other

## 2021-05-13 ENCOUNTER — Observation Stay (HOSPITAL_COMMUNITY): Payer: Medicare Other

## 2021-05-13 ENCOUNTER — Inpatient Hospital Stay (HOSPITAL_COMMUNITY)
Admission: EM | Admit: 2021-05-13 | Discharge: 2021-05-18 | DRG: 690 | Disposition: A | Discharge status: Skilled Nursing Facility | Payer: Medicare Other | Attending: Internal Medicine | Admitting: Internal Medicine

## 2021-05-13 ENCOUNTER — Encounter (HOSPITAL_COMMUNITY): Payer: Self-pay

## 2021-05-13 DIAGNOSIS — Z87891 Personal history of nicotine dependence: Secondary | ICD-10-CM

## 2021-05-13 DIAGNOSIS — R262 Difficulty in walking, not elsewhere classified: Secondary | ICD-10-CM | POA: Diagnosis not present

## 2021-05-13 DIAGNOSIS — Y92009 Unspecified place in unspecified non-institutional (private) residence as the place of occurrence of the external cause: Secondary | ICD-10-CM

## 2021-05-13 DIAGNOSIS — I10 Essential (primary) hypertension: Secondary | ICD-10-CM | POA: Diagnosis not present

## 2021-05-13 DIAGNOSIS — Z20822 Contact with and (suspected) exposure to covid-19: Secondary | ICD-10-CM | POA: Diagnosis present

## 2021-05-13 DIAGNOSIS — Z743 Need for continuous supervision: Secondary | ICD-10-CM | POA: Diagnosis not present

## 2021-05-13 DIAGNOSIS — M4854XA Collapsed vertebra, not elsewhere classified, thoracic region, initial encounter for fracture: Secondary | ICD-10-CM | POA: Diagnosis present

## 2021-05-13 DIAGNOSIS — H9193 Unspecified hearing loss, bilateral: Secondary | ICD-10-CM | POA: Diagnosis present

## 2021-05-13 DIAGNOSIS — R600 Localized edema: Secondary | ICD-10-CM | POA: Diagnosis not present

## 2021-05-13 DIAGNOSIS — M6281 Muscle weakness (generalized): Secondary | ICD-10-CM | POA: Diagnosis not present

## 2021-05-13 DIAGNOSIS — Z974 Presence of external hearing-aid: Secondary | ICD-10-CM | POA: Diagnosis not present

## 2021-05-13 DIAGNOSIS — Y92239 Unspecified place in hospital as the place of occurrence of the external cause: Secondary | ICD-10-CM | POA: Diagnosis not present

## 2021-05-13 DIAGNOSIS — C61 Malignant neoplasm of prostate: Secondary | ICD-10-CM | POA: Diagnosis present

## 2021-05-13 DIAGNOSIS — W19XXXA Unspecified fall, initial encounter: Secondary | ICD-10-CM | POA: Diagnosis not present

## 2021-05-13 DIAGNOSIS — C7982 Secondary malignant neoplasm of genital organs: Secondary | ICD-10-CM | POA: Diagnosis not present

## 2021-05-13 DIAGNOSIS — C7951 Secondary malignant neoplasm of bone: Secondary | ICD-10-CM | POA: Diagnosis present

## 2021-05-13 DIAGNOSIS — K5900 Constipation, unspecified: Secondary | ICD-10-CM | POA: Diagnosis not present

## 2021-05-13 DIAGNOSIS — R531 Weakness: Secondary | ICD-10-CM | POA: Diagnosis not present

## 2021-05-13 DIAGNOSIS — Z8042 Family history of malignant neoplasm of prostate: Secondary | ICD-10-CM | POA: Diagnosis not present

## 2021-05-13 DIAGNOSIS — M4804 Spinal stenosis, thoracic region: Secondary | ICD-10-CM | POA: Diagnosis present

## 2021-05-13 DIAGNOSIS — Z800 Family history of malignant neoplasm of digestive organs: Secondary | ICD-10-CM

## 2021-05-13 DIAGNOSIS — S3992XA Unspecified injury of lower back, initial encounter: Secondary | ICD-10-CM | POA: Diagnosis not present

## 2021-05-13 DIAGNOSIS — R609 Edema, unspecified: Secondary | ICD-10-CM | POA: Diagnosis not present

## 2021-05-13 DIAGNOSIS — J90 Pleural effusion, not elsewhere classified: Secondary | ICD-10-CM | POA: Diagnosis not present

## 2021-05-13 DIAGNOSIS — N139 Obstructive and reflux uropathy, unspecified: Secondary | ICD-10-CM | POA: Diagnosis not present

## 2021-05-13 DIAGNOSIS — M5134 Other intervertebral disc degeneration, thoracic region: Secondary | ICD-10-CM | POA: Diagnosis not present

## 2021-05-13 DIAGNOSIS — Z79899 Other long term (current) drug therapy: Secondary | ICD-10-CM

## 2021-05-13 DIAGNOSIS — R2681 Unsteadiness on feet: Secondary | ICD-10-CM | POA: Diagnosis not present

## 2021-05-13 DIAGNOSIS — Z7189 Other specified counseling: Secondary | ICD-10-CM | POA: Diagnosis not present

## 2021-05-13 DIAGNOSIS — T380X5A Adverse effect of glucocorticoids and synthetic analogues, initial encounter: Secondary | ICD-10-CM | POA: Diagnosis not present

## 2021-05-13 DIAGNOSIS — R339 Retention of urine, unspecified: Secondary | ICD-10-CM | POA: Diagnosis not present

## 2021-05-13 DIAGNOSIS — D72829 Elevated white blood cell count, unspecified: Secondary | ICD-10-CM | POA: Diagnosis not present

## 2021-05-13 DIAGNOSIS — I2510 Atherosclerotic heart disease of native coronary artery without angina pectoris: Secondary | ICD-10-CM | POA: Diagnosis not present

## 2021-05-13 DIAGNOSIS — N390 Urinary tract infection, site not specified: Secondary | ICD-10-CM | POA: Diagnosis not present

## 2021-05-13 DIAGNOSIS — R6889 Other general symptoms and signs: Secondary | ICD-10-CM | POA: Diagnosis not present

## 2021-05-13 DIAGNOSIS — W1830XA Fall on same level, unspecified, initial encounter: Secondary | ICD-10-CM | POA: Diagnosis present

## 2021-05-13 DIAGNOSIS — Z85048 Personal history of other malignant neoplasm of rectum, rectosigmoid junction, and anus: Secondary | ICD-10-CM | POA: Diagnosis not present

## 2021-05-13 DIAGNOSIS — S22060A Wedge compression fracture of T7-T8 vertebra, initial encounter for closed fracture: Secondary | ICD-10-CM | POA: Diagnosis not present

## 2021-05-13 DIAGNOSIS — Z515 Encounter for palliative care: Secondary | ICD-10-CM | POA: Diagnosis not present

## 2021-05-13 DIAGNOSIS — Z66 Do not resuscitate: Secondary | ICD-10-CM | POA: Diagnosis present

## 2021-05-13 DIAGNOSIS — Z7952 Long term (current) use of systemic steroids: Secondary | ICD-10-CM | POA: Diagnosis not present

## 2021-05-13 DIAGNOSIS — N3000 Acute cystitis without hematuria: Secondary | ICD-10-CM | POA: Diagnosis not present

## 2021-05-13 DIAGNOSIS — Z888 Allergy status to other drugs, medicaments and biological substances status: Secondary | ICD-10-CM

## 2021-05-13 DIAGNOSIS — R293 Abnormal posture: Secondary | ICD-10-CM | POA: Diagnosis not present

## 2021-05-13 DIAGNOSIS — S22060D Wedge compression fracture of T7-T8 vertebra, subsequent encounter for fracture with routine healing: Secondary | ICD-10-CM | POA: Diagnosis not present

## 2021-05-13 DIAGNOSIS — R278 Other lack of coordination: Secondary | ICD-10-CM | POA: Diagnosis not present

## 2021-05-13 DIAGNOSIS — M48061 Spinal stenosis, lumbar region without neurogenic claudication: Secondary | ICD-10-CM | POA: Diagnosis not present

## 2021-05-13 DIAGNOSIS — Z7401 Bed confinement status: Secondary | ICD-10-CM | POA: Diagnosis not present

## 2021-05-13 LAB — BASIC METABOLIC PANEL
Anion gap: 8 (ref 5–15)
BUN: 18 mg/dL (ref 8–23)
CO2: 25 mmol/L (ref 22–32)
Calcium: 9.1 mg/dL (ref 8.9–10.3)
Chloride: 102 mmol/L (ref 98–111)
Creatinine, Ser: 0.69 mg/dL (ref 0.61–1.24)
GFR, Estimated: >60 mL/min (ref >60)
Glucose, Bld: 131 mg/dL — ABNORMAL HIGH (ref 70–99)
Potassium: 3.8 mmol/L (ref 3.5–5.1)
Sodium: 135 mmol/L (ref 135–145)

## 2021-05-13 LAB — CBC WITH DIFFERENTIAL/PLATELET
Abs Immature Granulocytes: 0.04 10*3/uL (ref 0.00–0.07)
Basophils Absolute: 0 10*3/uL (ref 0.0–0.1)
Basophils Relative: 0 %
Eosinophils Absolute: 0 10*3/uL (ref 0.0–0.5)
Eosinophils Relative: 0 %
HCT: 31.3 % — ABNORMAL LOW (ref 39.0–52.0)
Hemoglobin: 10.5 g/dL — ABNORMAL LOW (ref 13.0–17.0)
Immature Granulocytes: 0 %
Lymphocytes Relative: 6 %
Lymphs Abs: 0.6 10*3/uL — ABNORMAL LOW (ref 0.7–4.0)
MCH: 30.7 pg (ref 26.0–34.0)
MCHC: 33.5 g/dL (ref 30.0–36.0)
MCV: 91.5 fL (ref 80.0–100.0)
Monocytes Absolute: 0.8 10*3/uL (ref 0.1–1.0)
Monocytes Relative: 8 %
Neutro Abs: 8 10*3/uL — ABNORMAL HIGH (ref 1.7–7.7)
Neutrophils Relative %: 86 %
Platelets: 336 10*3/uL (ref 150–400)
RBC: 3.42 MIL/uL — ABNORMAL LOW (ref 4.22–5.81)
RDW: 14.6 % (ref 11.5–15.5)
WBC: 9.4 10*3/uL (ref 4.0–10.5)
nRBC: 0 % (ref 0.0–0.2)

## 2021-05-13 LAB — RESP PANEL BY RT-PCR (FLU A&B, COVID) ARPGX2
Influenza A by PCR: NEGATIVE
Influenza B by PCR: NEGATIVE
SARS Coronavirus 2 by RT PCR: NEGATIVE

## 2021-05-13 LAB — URINALYSIS, ROUTINE W REFLEX MICROSCOPIC
Bilirubin Urine: NEGATIVE
Glucose, UA: NEGATIVE mg/dL
Hgb urine dipstick: NEGATIVE
Ketones, ur: NEGATIVE mg/dL
Nitrite: POSITIVE — AB
Protein, ur: NEGATIVE mg/dL
Specific Gravity, Urine: 1.012 (ref 1.005–1.030)
pH: 7 (ref 5.0–8.0)

## 2021-05-13 LAB — TROPONIN I (HIGH SENSITIVITY)
Troponin I (High Sensitivity): 16 ng/L (ref <18)
Troponin I (High Sensitivity): 16 ng/L (ref <18)

## 2021-05-13 LAB — VITAMIN B12: Vitamin B-12: 361 pg/mL (ref 180–914)

## 2021-05-13 LAB — CK: Total CK: 165 U/L (ref 49–397)

## 2021-05-13 IMAGING — CT CT T SPINE W/O CM
3 of 4 series · 9 of 33 positions shown, 10 images · non-contrast
Comparison: CT [DATE]

CLINICAL DATA: Back trauma, no prior imaging (Age >= 16y)

EXAM:
CT THORACIC SPINE WITHOUT CONTRAST
TECHNIQUE: Multidetector CT images of the thoracic were obtained using the
standard protocol without intravenous contrast.
RADIATION DOSE REDUCTION: This exam was performed according to the
departmental dose-optimization program which includes automated
exposure control, adjustment of the mA and/or kV according to
patient size and/or use of iterative reconstruction technique.

[Series 4: t spine st · axial · 0.31mm/px · z∈[-174,-174]mm · 1 of 160 slices shown, 2 images]
[im 80/160  soft-tissue]
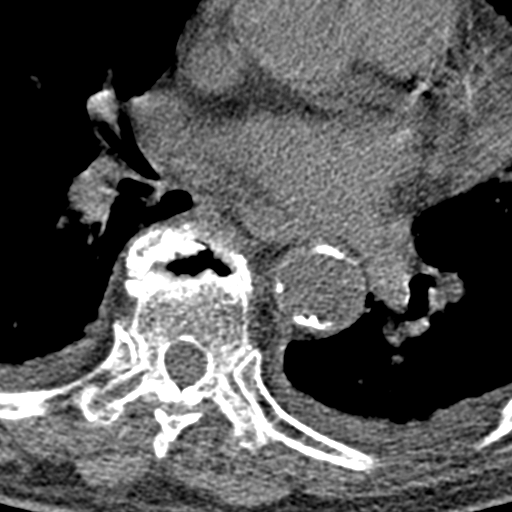
[im 80/160  bone]
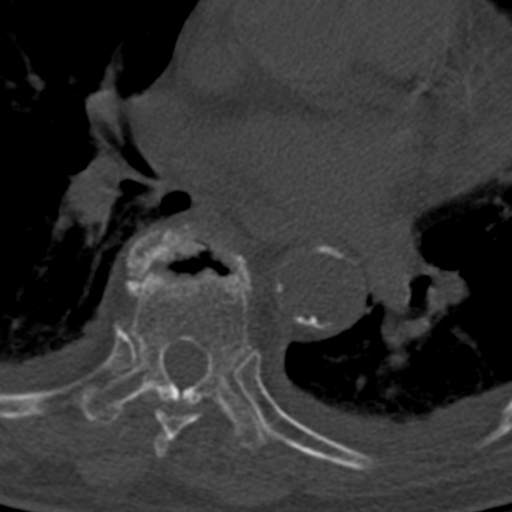

[Series 8: coronal bone · coronal · 0.23mm/px · 3 of 87 slices shown]
[im 18/87  bone]
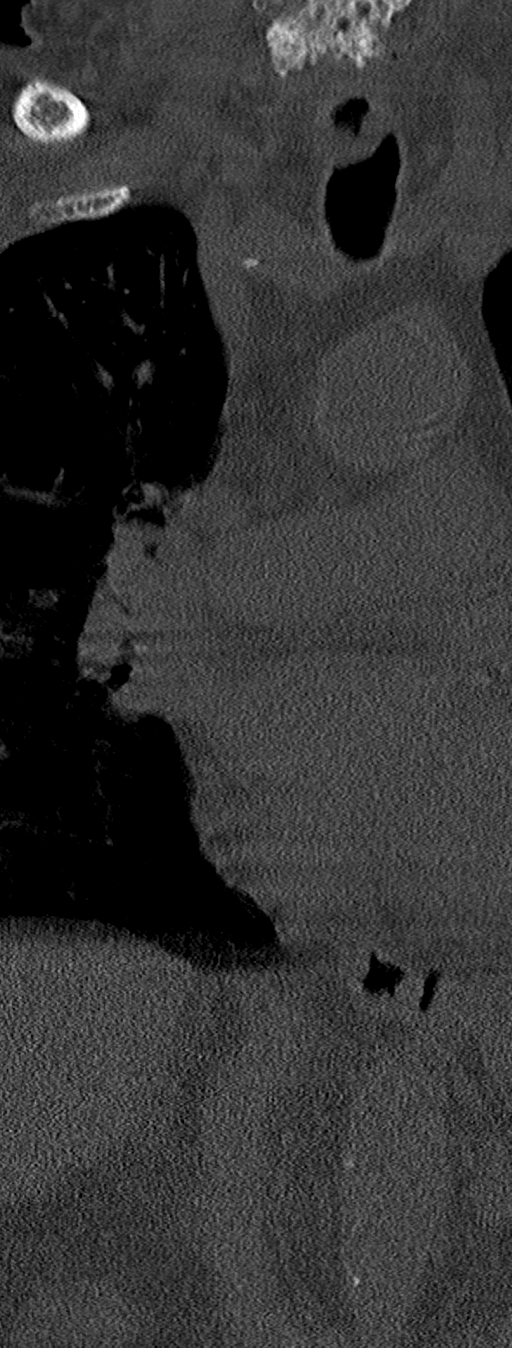
[im 35/87  bone]
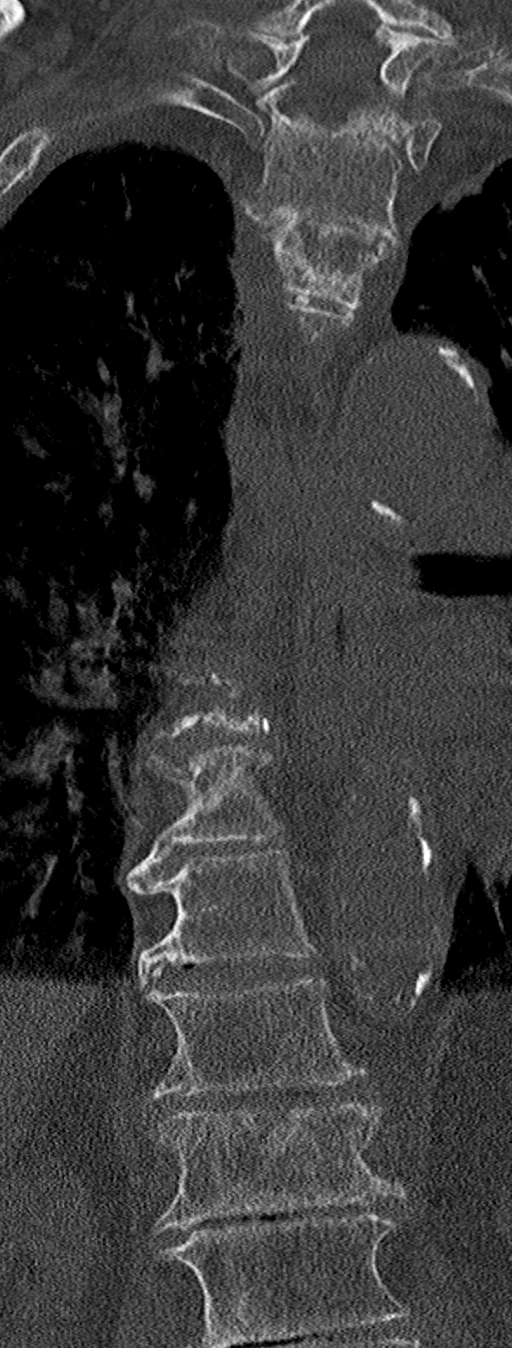
[im 52/87  bone]
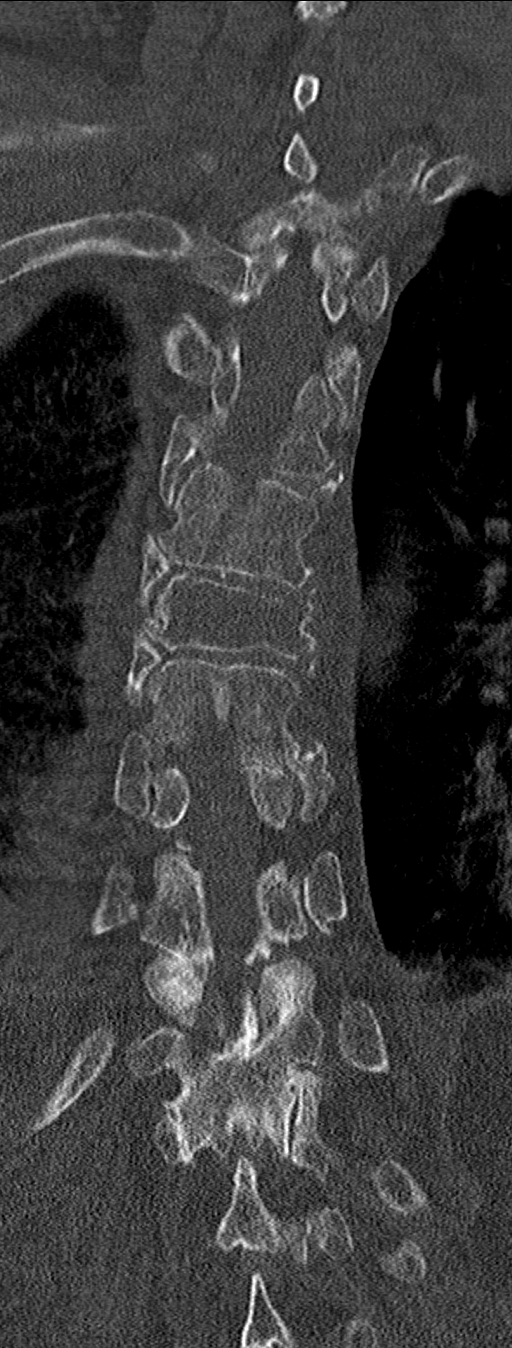

[Series 9: sagittal bone · sagittal · 0.34mm/px · 5 of 61 slices shown]
[im 21/61  bone]
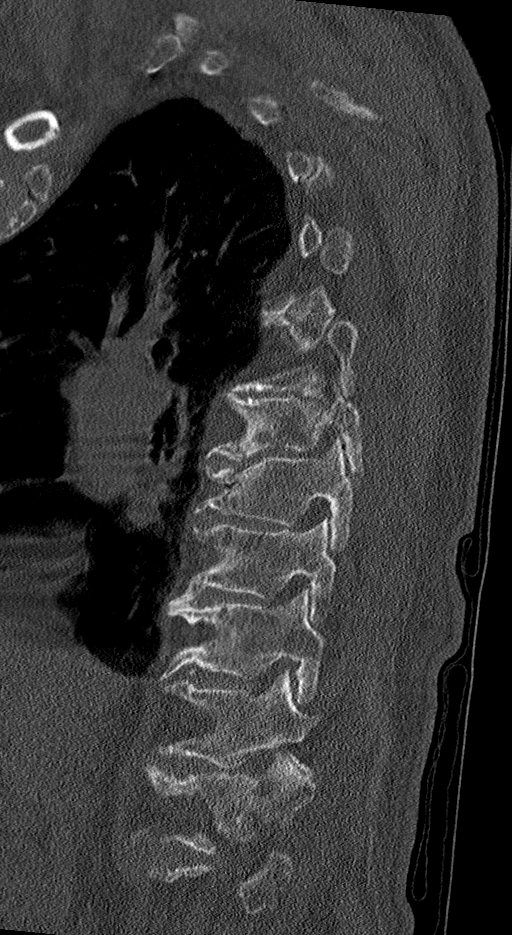
[im 26/61  bone]
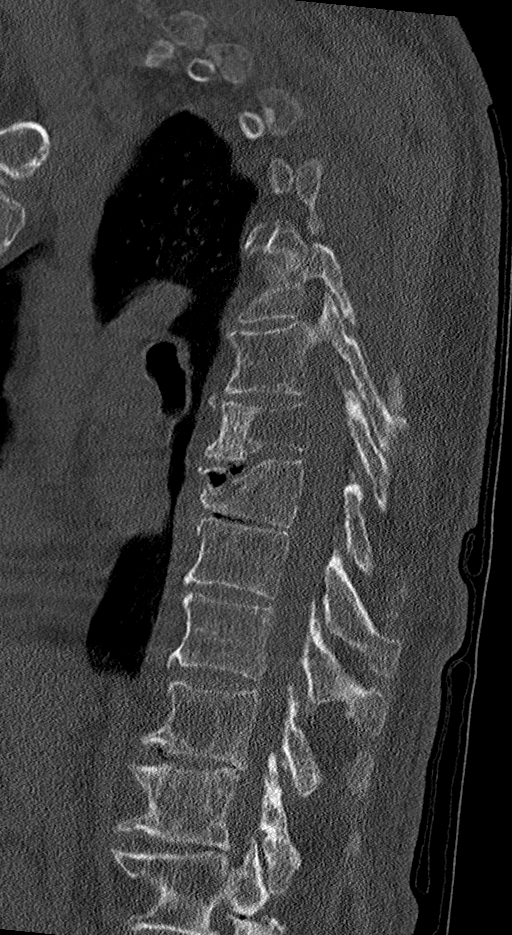
[im 31/61  bone]
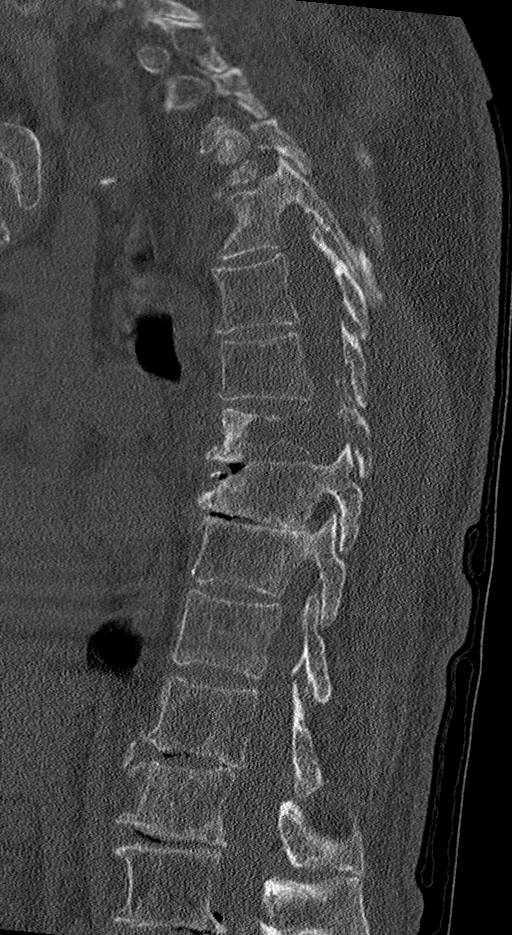
[im 36/61  bone]
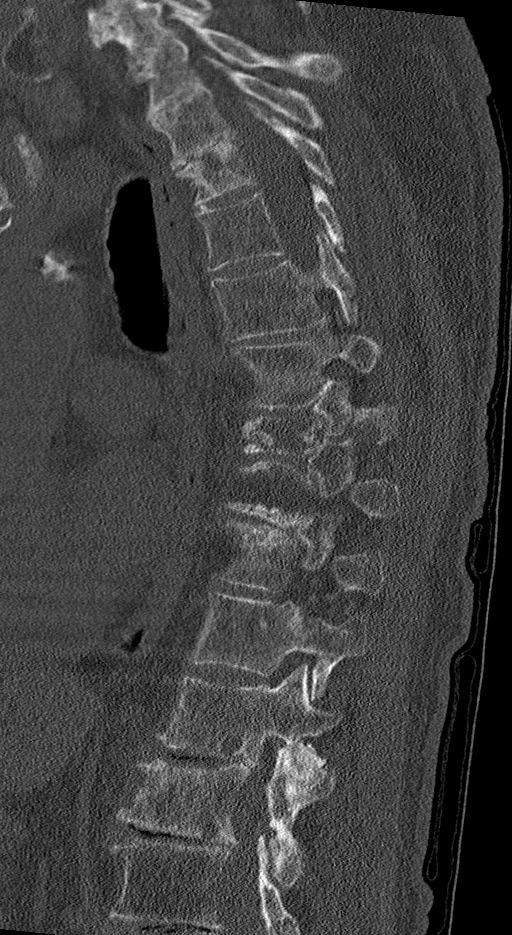
[im 41/61  bone]
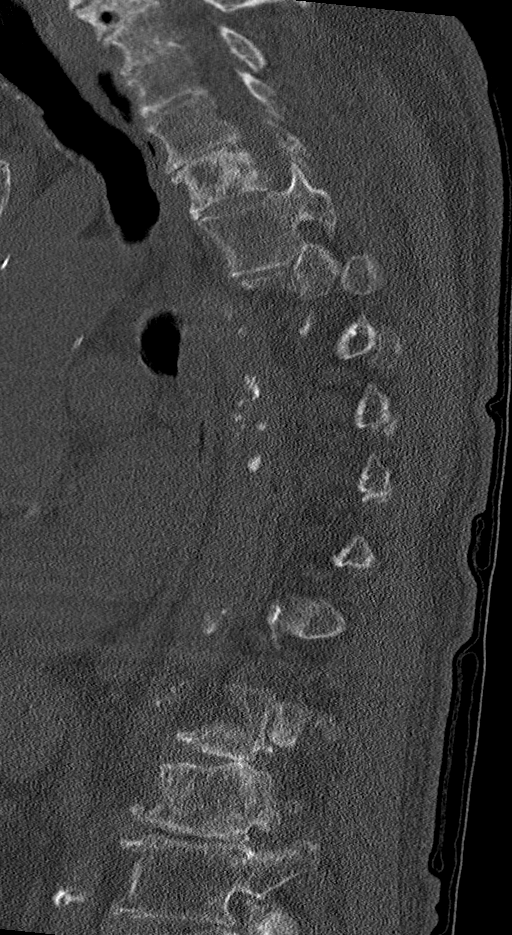

[9 of 33 positions shown; findings below may reference images not displayed]

FINDINGS: Alignment: Dextroconvex curvature of the thoracic spine. No static
listhesis.

Vertebrae: There is osseous metastatic disease involving the right
transverse process and pedicle of T1, the T3 vertebral body and
posterior elements on the left, T7 and left posterior elements, T11
vertebral body, and T12. There is progressive height loss of T7
posteriorly in comparison to prior PET-CT. There is progressive
height loss of the T7 vertebral body posteriorly, with up to 50%
height loss, previously 25%. The T7 lesion encroaches upon the
spinal canal and left neural foramen. Unchanged anterior height loss
of T12. Other osseous metastatic lesions are similar.

There is a new anterior compression fracture of T8, presumably
pathologic, with up to 25% height loss.

Paraspinal and other soft tissues: Small bilateral pleural
effusions. Aortic atherosclerosis. Coronary artery calcifications.

Disc levels: There is multilevel degenerative disc disease and facet
arthropathy most severe the upper and lower thoracic spine and at
T8-T9.
IMPRESSION: Osseous metastatic disease in the thoracic spine with increased,
progressive height loss of T7, now 50%, previously 25%. There is
encroachment of the T7 metastatic lesion upon the spinal canal and
left neural foramen. Other osseous metastatic lesions are similar to
recent PET-CT.

New anterior compression fracture of T8, presumably pathologic, with
up to 25% height loss.

## 2021-05-13 IMAGING — CT CT L SPINE W/O CM
3 of 4 series · 13 of 33 positions shown, 16 images · non-contrast
Comparison: Correlation made with CT abdomen [DATE]

CLINICAL DATA: Back trauma, no prior imaging (Age >= 16y); fall



[Series 4: l spine st · axial · 0.29mm/px · z∈[-278,-126]mm · 5 of 115 slices shown, 7 images]
[im 20/115  soft-tissue]
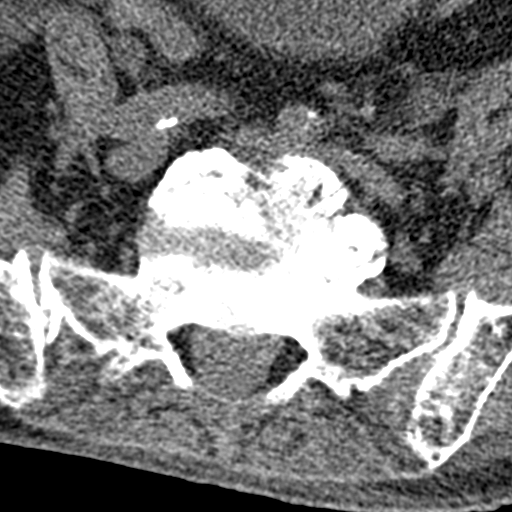
[im 20/115  bone]
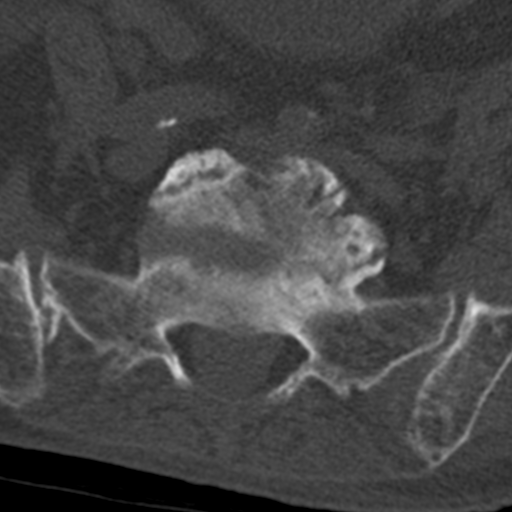
[im 39/115  bone]
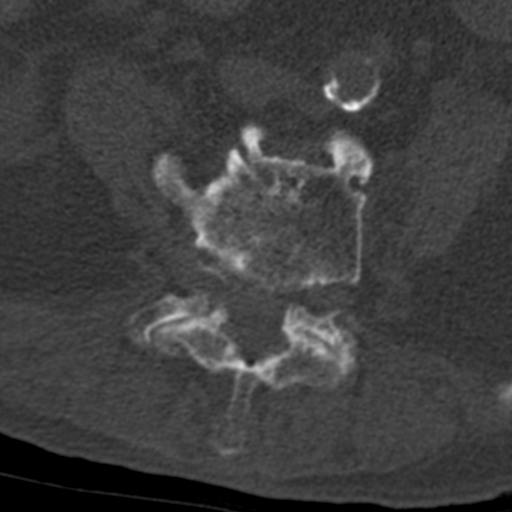
[im 58/115  bone]
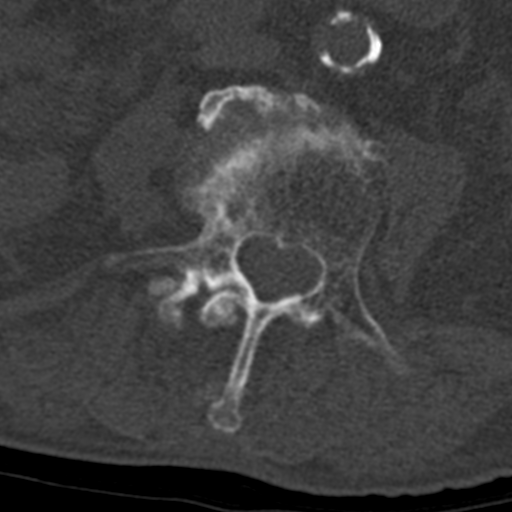
[im 77/115  bone]
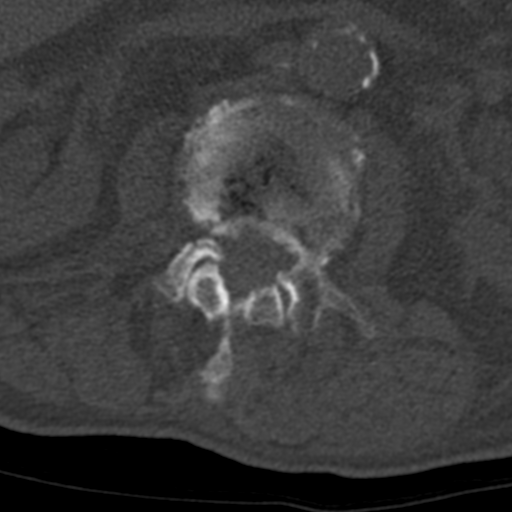
[im 96/115  soft-tissue]
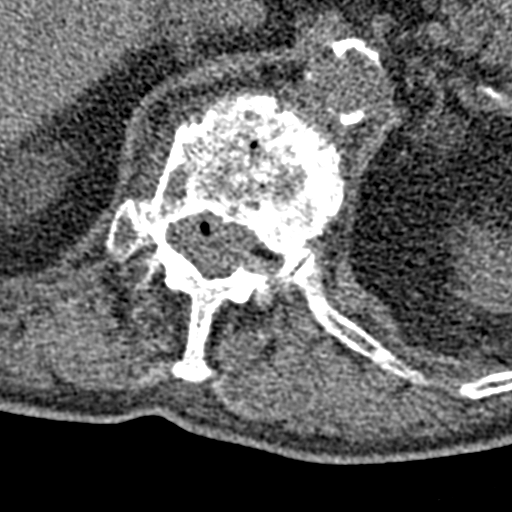
[im 96/115  bone]
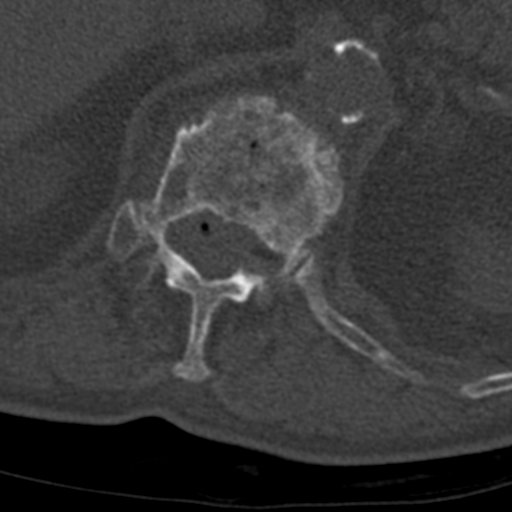

[Series 8: coronal bone · coronal · 0.33mm/px · 3 of 72 slices shown]
[im 15/72  bone]
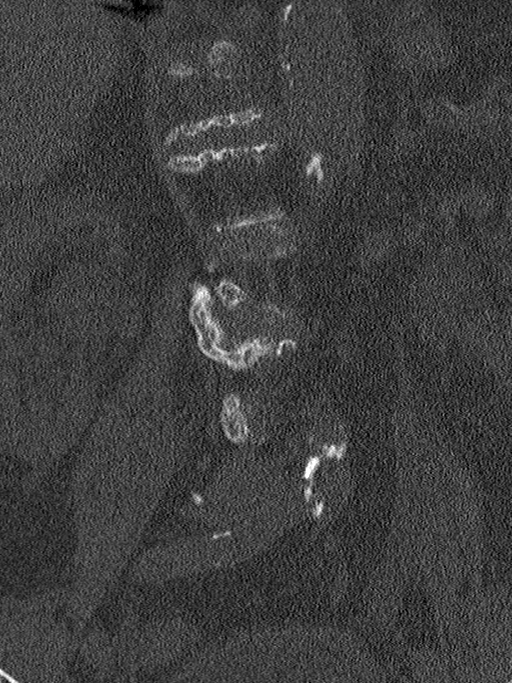
[im 29/72  bone]
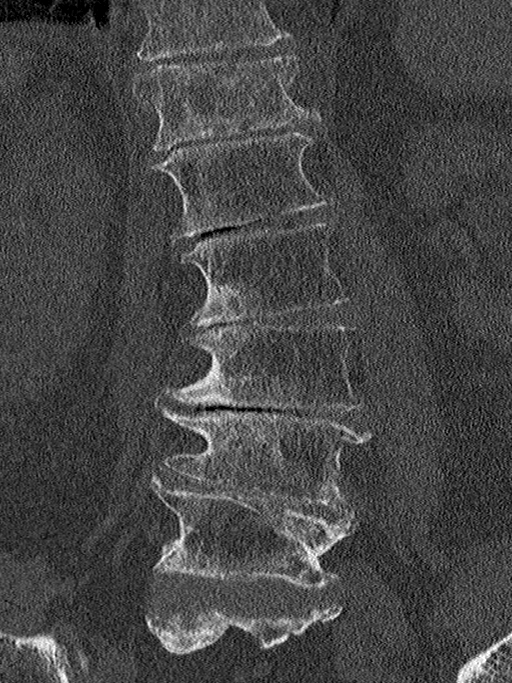
[im 43/72  bone]
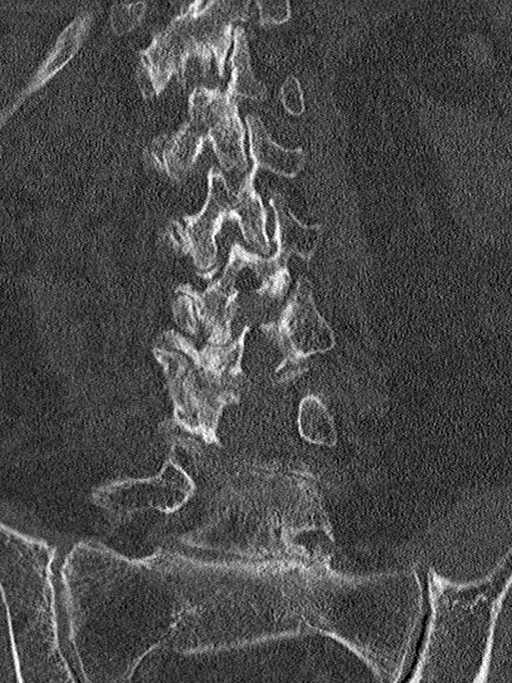

[Series 10: sagittal st · sagittal · 0.33mm/px · 5 of 79 slices shown, 6 images]
[im 27/79  bone]
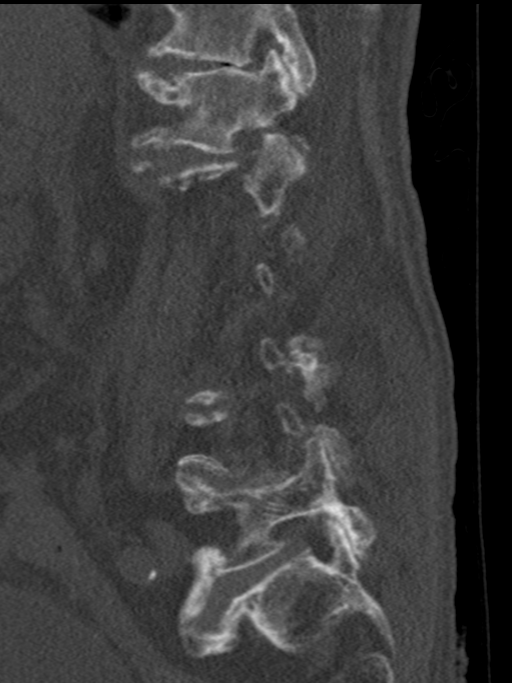
[im 33/79  bone]
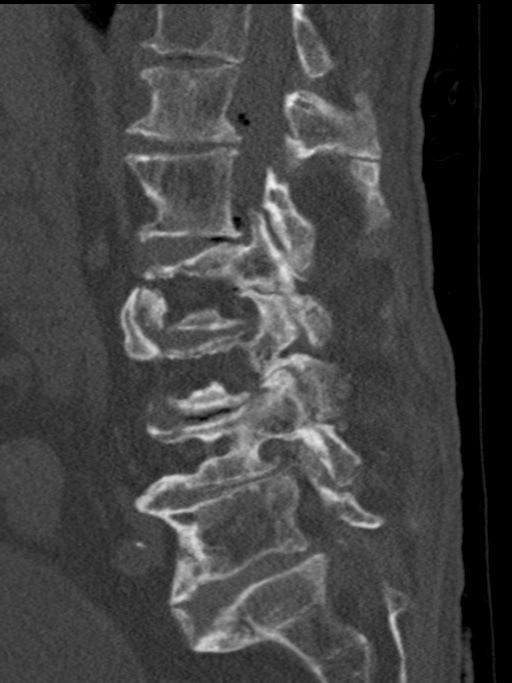
[im 40/79  soft-tissue]
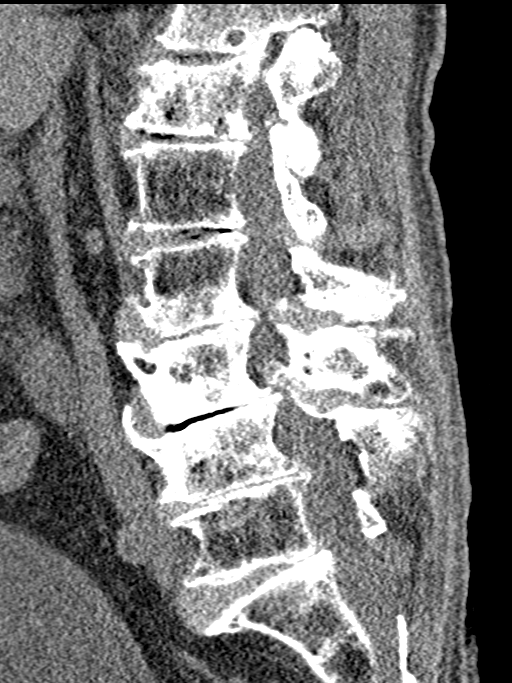
[im 40/79  bone]
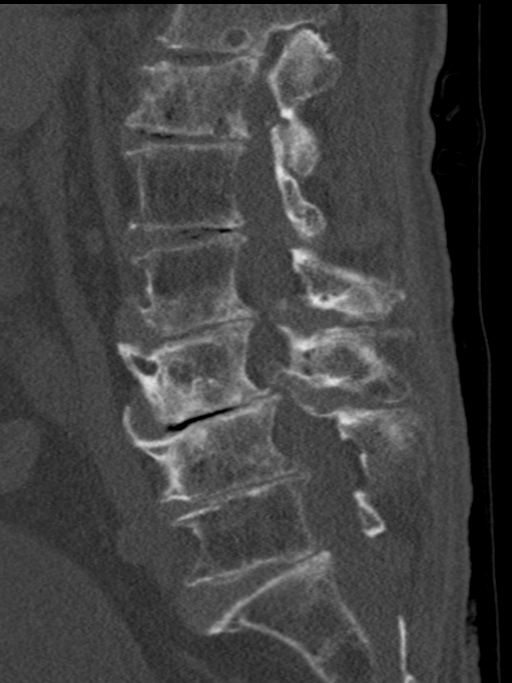
[im 46/79  bone]
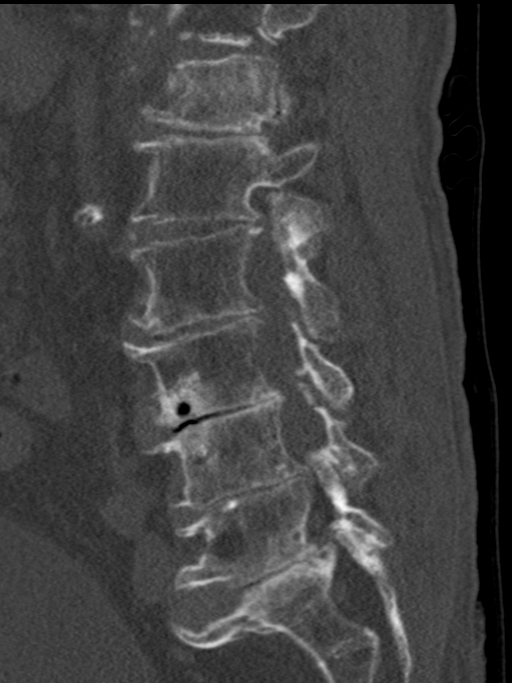
[im 53/79  bone]
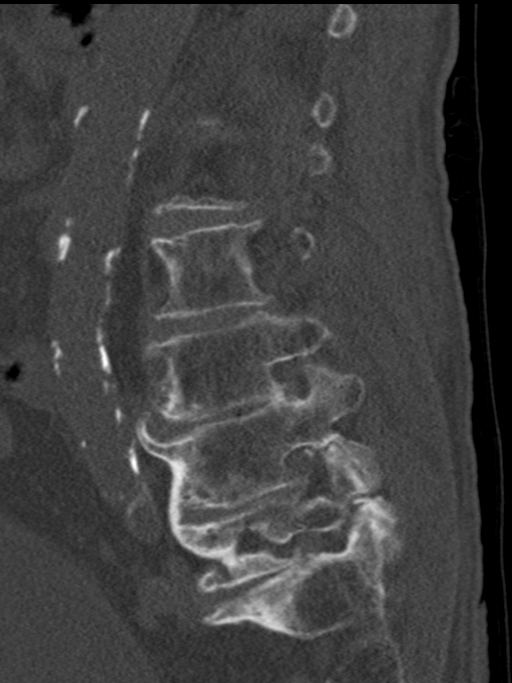

[13 of 33 positions shown; findings below may reference images not displayed]

FINDINGS: Segmentation: 5 lumbar type vertebrae.

Alignment: Stable.  Levocurvature and mild degenerative listhesis.

Vertebrae: Stable vertebral body heights. No acute fracture
identified. Multilevel degenerative endplate irregularity. Known
metastatic disease at T12. Decreased osseous mineralization.

Paraspinal and other soft tissues: No acute abnormality.

Disc levels: Advanced multilevel degenerative changes with disc
space narrowing, disc bulges and endplate osteophytes, facet
hypertrophy, and ligamentum flavum thickening. Canal stenosis is
greatest at L3-L4. There is multilevel significant foraminal
narrowing.
IMPRESSION: No acute lumbar spine fracture.

## 2021-05-13 MED ORDER — ENOXAPARIN SODIUM 30 MG/0.3ML IJ SOSY
30.0000 mg | PREFILLED_SYRINGE | INTRAMUSCULAR | Status: DC
  Administered 2021-05-13: 30 mg via SUBCUTANEOUS
  Filled 2021-05-13: qty 0.3

## 2021-05-13 MED ORDER — POLYETHYLENE GLYCOL 3350 17 G PO PACK
17.0000 g | PACK | Freq: Every day | ORAL | Status: DC
  Administered 2021-05-14 – 2021-05-17 (×4): 17 g via ORAL
  Filled 2021-05-13 (×4): qty 1

## 2021-05-13 MED ORDER — SODIUM CHLORIDE 0.9% FLUSH: 3.0000 mL | INTRAVENOUS | Status: DC | PRN

## 2021-05-13 MED ORDER — MORPHINE SULFATE (PF) 2 MG/ML IV SOLN: 2.0000 mg | INTRAVENOUS | Status: DC | PRN

## 2021-05-13 MED ORDER — SODIUM CHLORIDE 0.9% FLUSH
3.0000 mL | Freq: Two times a day (BID) | INTRAVENOUS | Status: DC
  Administered 2021-05-13 – 2021-05-18 (×10): 3 mL via INTRAVENOUS

## 2021-05-13 MED ORDER — HYDRALAZINE HCL 20 MG/ML IJ SOLN
5.0000 mg | Freq: Four times a day (QID) | INTRAMUSCULAR | Status: DC | PRN
  Administered 2021-05-14 – 2021-05-17 (×2): 5 mg via INTRAVENOUS
  Filled 2021-05-13 (×2): qty 1

## 2021-05-13 MED ORDER — TAMSULOSIN HCL 0.4 MG PO CAPS
0.4000 mg | ORAL_CAPSULE | Freq: Every day | ORAL | Status: DC
  Administered 2021-05-14 – 2021-05-18 (×5): 0.4 mg via ORAL
  Filled 2021-05-13 (×5): qty 1

## 2021-05-13 MED ORDER — ACETAMINOPHEN 650 MG RE SUPP: 650.0000 mg | Freq: Four times a day (QID) | RECTAL | Status: DC | PRN

## 2021-05-13 MED ORDER — ONDANSETRON HCL 4 MG PO TABS: 4.0000 mg | ORAL_TABLET | Freq: Four times a day (QID) | ORAL | Status: DC | PRN

## 2021-05-13 MED ORDER — OXYCODONE HCL 5 MG PO TABS
5.0000 mg | ORAL_TABLET | Freq: Once | ORAL | Status: AC
  Administered 2021-05-13: 5 mg via ORAL
  Filled 2021-05-13: qty 1

## 2021-05-13 MED ORDER — SODIUM CHLORIDE 0.9 % IV SOLN
2.0000 g | INTRAVENOUS | Status: DC
  Administered 2021-05-14 – 2021-05-17 (×4): 2 g via INTRAVENOUS
  Filled 2021-05-13 (×5): qty 20

## 2021-05-13 MED ORDER — OXYCODONE HCL 5 MG PO TABS
5.0000 mg | ORAL_TABLET | Freq: Four times a day (QID) | ORAL | Status: DC
  Administered 2021-05-14 – 2021-05-15 (×9): 10 mg via ORAL
  Administered 2021-05-16 (×3): 5 mg via ORAL
  Administered 2021-05-16: 10 mg via ORAL
  Administered 2021-05-17 (×3): 5 mg via ORAL
  Filled 2021-05-13 (×2): qty 2
  Filled 2021-05-13: qty 1
  Filled 2021-05-13: qty 2
  Filled 2021-05-13: qty 1
  Filled 2021-05-13 (×3): qty 2
  Filled 2021-05-13: qty 1
  Filled 2021-05-13: qty 2
  Filled 2021-05-13 (×3): qty 1
  Filled 2021-05-13: qty 2
  Filled 2021-05-13: qty 1
  Filled 2021-05-13 (×3): qty 2

## 2021-05-13 MED ORDER — SODIUM CHLORIDE 0.9 % IV BOLUS
1000.0000 mL | Freq: Once | INTRAVENOUS | Status: AC
  Administered 2021-05-13: 1000 mL via INTRAVENOUS

## 2021-05-13 MED ORDER — SODIUM CHLORIDE 0.9 % IV SOLN: 250.0000 mL | INTRAVENOUS | Status: DC | PRN

## 2021-05-13 MED ORDER — ENSURE ENLIVE PO LIQD
237.0000 mL | Freq: Two times a day (BID) | ORAL | Status: DC
  Administered 2021-05-14 – 2021-05-18 (×7): 237 mL via ORAL

## 2021-05-13 MED ORDER — SODIUM CHLORIDE 0.9 % IV SOLN
1.0000 g | Freq: Once | INTRAVENOUS | Status: AC
  Administered 2021-05-13: 1 g via INTRAVENOUS
  Filled 2021-05-13: qty 10

## 2021-05-13 MED ORDER — ACETAMINOPHEN 325 MG PO TABS: 650.0000 mg | ORAL_TABLET | Freq: Four times a day (QID) | ORAL | Status: DC | PRN

## 2021-05-13 MED ORDER — ONDANSETRON HCL 4 MG/2ML IJ SOLN: 4.0000 mg | Freq: Four times a day (QID) | INTRAMUSCULAR | Status: DC | PRN

## 2021-05-13 MED ORDER — IBUPROFEN 200 MG PO TABS
600.0000 mg | ORAL_TABLET | Freq: Once | ORAL | Status: AC
  Administered 2021-05-13: 600 mg via ORAL
  Filled 2021-05-13: qty 3

## 2021-05-13 NOTE — H&P (Addendum)
? ? ?History and Physical ? ?Martin Foster JGG:[id number] DOB: [birth year] DOA: 05/13/2021 ? ?PCP: [name], MD ?Patient coming from: Home  ? ?I have personally briefly reviewed patient's old medical records in [place] ? ? ?Chief Complaint: weakness, inability to walk.  ? ?HPI: Martin Foster is a 88 y.o. 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 Ander Slade  for radiation treatment, he completed 5 radiation treatments.  He presents today with inability to walk, due to pain and weakness in his legs and hip.  He presented initially confused in the ED due to severe pain.  After a dose of oral oxycodone his pain has improved, but he was not able to ambulate. ? ?He was also found to have a UTI, he reported some dysuria and suprapubic pain.  UA with significant nitrates and 21-50 white blood cells. ? ? ? ?Review of Systems: All systems reviewed and apart from history of presenting illness, are negative. ? ?Past Medical History:  ?Diagnosis Date  ? Hypertension   ? Prostate cancer ([place])   ? Rectal cancer ([place]) 01/09/2020  ? Renal disorder   ? catheter since 06/26/11  ? ?Past Surgical History:  ?Procedure Laterality Date  ? APPENDECTOMY  11/2019  ? ?Social History:  reports that he has quit smoking. He has never used smokeless tobacco. He reports that he does not drink alcohol and does not use drugs. ? ? ?Allergies  ?Allergen Reactions  ? Lisinopril Swelling  ?  Other reaction(s): angioedema  ? Olmesartan Medoxomil-Hctz Swelling  ?  Other reaction(s): swelling  ? Ampicillin Swelling  ? Ciprofibrate Swelling  ? Ciprofloxacin Swelling  ? Erythromycin Swelling  ? Fd&C Yellow #6 [Yellow Dye]   ?  Other reaction(s): Unknown  ? Other Other (See Comments)  ?  Equal sweetner in the blue packet: unknown reaction  ? ? ?Family History  ?Problem Relation Age of Onset  ? Gastric cancer Mother   ? Prostate cancer Father   ? Colon cancer Father   ?   ? ?Prior to Admission  medications   ?Medication Sig Start Date End Date Taking? Authorizing Provider  ?abiraterone acetate (ZYTIGA) 250 MG tablet Take 1,000 mg by mouth daily. 03/03/20   [provider]  ?Ascorbic Acid (VITAMIN C) 500 MG CHEW 1 tablet    [provider]  ?capecitabine (XELODA) 500 MG tablet Take 3 tablets (1,500 mg total) by mouth 2 (two) times daily after a meal. 04/19/20   Truitt Merle, MD  ?cholecalciferol (VITAMIN D3) 25 MCG (1000 UNIT) tablet 1 tablet    [provider]  ?cyanocobalamin 100 MCG tablet Take by mouth.    [provider]  ?denosumab (PROLIA) 60 MG/ML SOSY injection 60 mg    [provider]  ?ferrous sulfate 325 (65 FE) MG tablet Take 325 mg by mouth daily. 01/06/20   [provider]  ?leuprolide (LUPRON DEPOT, 67-MONTH,) 3.75 MG injection     [provider]  ?losartan (COZAAR) 100 MG tablet Take 100 mg by mouth daily. 03/14/20   [provider]  ?Misc Natural Products (OSTEO BI-FLEX ADV DOUBLE ST PO) 1 tablet    [provider]  ?morphine (MSIR) 15 MG tablet Take 1 tablet (15 mg total) by mouth every 4 (four) hours as needed for severe pain. 04/06/21   Bruning, Ailene Ards, PA-C  ?Multiple Vitamins-Minerals (MULTIVITAMIN ADULT EXTRA C PO) 1 tablet    [provider]  ?  oxyCODONE (OXY IR/ROXICODONE) 5 MG immediate release tablet Take 5-10 mg by mouth every 6 (six) hours. 04/02/21   [provider]  ?predniSONE 5 MG/ML concentrated solution 1 ml    [provider]  ?Tamsulosin HCl (FLOMAX) 0.4 MG CAPS Take 0.4 mg by mouth daily.    [provider]  ?vitamin E 180 MG (400 UNITS) capsule 1 capsule    [provider]  ? ?Physical Exam: ?Vitals:  ? 05/13/21 1500 05/13/21 1530 05/13/21 1600 05/13/21 1700  ?BP: 137/66 133/62 (!) 142/75   ?Pulse: 80 67 75   ?Resp: 16 (!) 23 19   ?Temp:    98 ?F (36.7 ?C)  ?TempSrc:    Oral  ?SpO2: 96% 97% 95%   ? ? ?General exam: Moderately built and nourished patient,  lying comfortably supine on the gurney in no obvious distress. ?Head, eyes and ENT: Nontraumatic and normocephalic. Pupils equally reacting to light and accommodation. Oral mucosa moist. ?Neck: Supple. No JVD, carotid bruit or thyromegaly. ?Lymphatics: No lymphadenopathy. ?Respiratory system: Clear to auscultation. No increased work of breathing. ?Cardiovascular system: S1 and S2 heard, RRR. No JVD, murmurs, gallops, clicks or pedal edema. ?Gastrointestinal system: Abdomen is nondistended, soft and nontender. Normal bowel sounds heard. No organomegaly or masses appreciated. ?Central nervous system: Alert and oriented. No focal neurological deficits. ?Extremities: Symmetric 5 x 5 power. Peripheral pulses symmetrically felt.  ?Skin: No rashes or acute findings. ?Musculoskeletal system: Negative exam. ?Psychiatry: Pleasant and cooperative. ? ? ?Labs on Admission:  ?Basic Metabolic Panel: ?Recent Labs  ?Lab 05/13/21 ?1226  ?NA 135  ?K 3.8  ?CL 102  ?CO2 25  ?GLUCOSE 131*  ?BUN 18  ?CREATININE 0.69  ?CALCIUM 9.1  ? ?Liver Function Tests: ?No results for input(s): AST, ALT, ALKPHOS, BILITOT, PROT, ALBUMIN in the last 168 hours. ?No results for input(s): LIPASE, AMYLASE in the last 168 hours. ?No results for input(s): AMMONIA in the last 168 hours. ?CBC: ?Recent Labs  ?Lab 05/13/21 ?1226  ?WBC 9.4  ?NEUTROABS 8.0*  ?HGB 10.5*  ?HCT 31.3*  ?MCV 91.5  ?PLT 336  ? ?Cardiac Enzymes: ?Recent Labs  ?Lab 05/13/21 ?1226  ?CKTOTAL 165  ? ? ?BNP (last 3 results) ?No results for input(s): PROBNP in the last 8760 hours. ?CBG: ?No results for input(s): GLUCAP in the last 168 hours. ? ?Radiological Exams on Admission: ?CT Lumbar Spine Wo Contrast ? ?Result Date: 05/13/2021 ?CLINICAL DATA:  Back trauma, no prior imaging (Age >= 16y); fall EXAM: CT LUMBAR SPINE WITHOUT CONTRAST TECHNIQUE: Multidetector CT imaging of the lumbar spine was performed without intravenous contrast administration. Multiplanar CT image reconstructions were also  generated. RADIATION DOSE REDUCTION: This exam was performed according to the departmental dose-optimization program which includes automated exposure control, adjustment of the mA and/or kV according to patient size and/or use of iterative reconstruction technique. COMPARISON:  Correlation made with CT abdomen 04/03/2021 FINDINGS: Segmentation: 5 lumbar type vertebrae. Alignment: Stable.  Levocurvature and mild degenerative listhesis. Vertebrae: Stable vertebral body heights. No acute fracture identified. Multilevel degenerative endplate irregularity. Known metastatic disease at T12. Decreased osseous mineralization. Paraspinal and other soft tissues: No acute abnormality. Disc levels: Advanced multilevel degenerative changes with disc space narrowing, disc bulges and endplate osteophytes, facet hypertrophy, and ligamentum flavum thickening. Canal stenosis is greatest at L3-L4. There is multilevel significant foraminal narrowing. IMPRESSION: No acute lumbar spine fracture. Electronically Signed   By: Macy Mis M.D.   On: 05/13/2021 14:13   ? ?EKG: Independently reviewed.  Sinus  rhythm.   ? ?Assessment/Plan ?Principal Problem: ?  UTI (urinary tract infection) ?Active Problems: ?  Prostate cancer metastatic to bone [facility]) ?  Hypertension ? ? ?1-UTI: ?Patient presented with dysuria, UA with positive nitrates and 21-50 white blood cell. ?Follow urine culture ?Continue With IV ceftriaxone ? ?2-Weakness, inability to ambulate: ?Plan to proceed with thoracic CT spine ?PT evaluation ?Check B12 ? ?3-Metastatic prostate cancer to T7; ?He completed 5 treatments of radiation followed by Dr. Tammi Klippel ?Awaiting CT results, might need to discuss results with Dr. Tammi Klippel ?He responded well to oxycodone, that he received in the ED.  We will Continue with oxycodone as needed ?Continue with Flomax.  ? ?4-Hypertension:  Awaiting med rec. PRN hydralazine order.  ?5-Left lower extremity edema: Check doppler ? ? ?DVT Prophylaxis:  Lovenox ?Code Status: DNR  ?Family Communication: Step daughter over phone ?Disposition Plan: admit under observation for CT chest, Tx UTI, PT evaluation, Pain controlled.  ? ? ?Time spent: 75 minutes.  ? ?B

## 2021-05-13 NOTE — Progress Notes (Addendum)
  Radiation Oncology         [phone] ________________________________  Name: Martin Foster MRN: [id number]  Date: 04/14/2021  DOB: 1925/08/21  End of Treatment Note  Diagnosis:    88 yo man at risk for spinal cord compression at T7 from metastatic castration resistant prostate cancer     Indication for treatment:  Curative, Definitive SBRT       Radiation treatment dates:   04/08/21- 04/14/21  Site/dose:   The target at T7 was treated to 20 Gy in 5 fractions of 4 Gy  Beams/energy:   The patient was treated using stereotactic body radiotherapy according to a 3D conformal radiotherapy plan.  Volumetric arc fields were employed to deliver 6 MV X-rays.  Image guidance was performed with per fraction cone beam CT prior to treatment under personal MD supervision.  Immobilization was achieved using BodyFix Pillow.  Narrative: The patient tolerated radiation treatment relatively well without any ill side effects. He did report modest fatigue and mild improvement in his back pain towards the end of treatment.  Plan: The patient has completed radiation treatment. The patient will return to radiation oncology clinic for routine followup in one month. I advised them to call or return sooner if they have any questions or concerns related to their recovery or treatment. ________________________________  Sheral Apley. Tammi Klippel, M.D.

## 2021-05-13 NOTE — ED Triage Notes (Signed)
Pt presents via EMS from home with c/o fall that occurred today. Pt fell from the toilet. Pt is alert and oriented per EMS, hard of hearing. Pt does use a walker at baseline.  ?

## 2021-05-13 NOTE — ED Notes (Signed)
This RN spoke with Rod Can [phone], pts stepdaughter, who said states a family friend will be picking up pt around 5 pm. Family has no one who can be here sooner.  ?

## 2021-05-13 NOTE — ED Notes (Signed)
Attempted to call pts wife, Kimarion Chery regarding pts discharge, pt arrived via EMS. No answer.  ?

## 2021-05-13 NOTE — ED Notes (Signed)
Transport called.

## 2021-05-13 NOTE — ED Provider Notes (Signed)
?[facility] DEPT ?Provider Note ? ? ?CSN: [id number] ?Arrival date & time: 05/13/21  1047 ? ?  ? ?History ? ?Chief Complaint  ?Patient presents with  ? Fall  ? ? ?Martin Foster is a 88 y.o. male presenting from home with generalized weakness.  The patient reports that he was on the toilet yesterday evening early this morning and was having difficulty getting up, when he tried to stand up with his walker he said "my legs just gave out on me and I had no strength".  He said he fell to the ground and spent the night on the ground, several hours, prior to getting help in the morning.  He says his arms and legs feel generally weak.  He denies chest pain or pressure.  He typically walks with a walker.  He lives at home with his wife.  He says since coming into the ER he is overall feeling better.  He is very hard of hearing. ? ?He reports a history of colon cancer for which he is being treated with chemotherapy. ? ?HPI ? ?  ? ?Home Medications ?Prior to Admission medications   ?Medication Sig Start Date End Date Taking? Authorizing Provider  ?losartan (COZAAR) 100 MG tablet Take 100 mg by mouth daily. 03/14/20  Yes [provider]  ?Multiple Vitamins-Minerals (MULTIVITAMIN ADULT EXTRA C PO) 1 tablet   Yes [provider]  ?Tamsulosin HCl (FLOMAX) 0.4 MG CAPS Take 0.4 mg by mouth daily.   Yes [provider]  ?vitamin E 180 MG (400 UNITS) capsule 1 capsule   Yes [provider]  ?abiraterone acetate (ZYTIGA) 250 MG tablet Take 1,000 mg by mouth daily. 03/03/20   [provider]  ?Ascorbic Acid (VITAMIN C) 500 MG CHEW 1 tablet    [provider]  ?capecitabine (XELODA) 500 MG tablet Take 3 tablets (1,500 mg total) by mouth 2 (two) times daily after a meal. 04/19/20   Truitt Merle, MD  ?cholecalciferol (VITAMIN D3) 25 MCG (1000 UNIT) tablet 1 tablet    [provider]  ?cyanocobalamin 100 MCG tablet Take by mouth.    [provider]   ?denosumab (PROLIA) 60 MG/ML SOSY injection 60 mg    [provider]  ?ferrous sulfate 325 (65 FE) MG tablet Take 325 mg by mouth daily. 01/06/20   [provider]  ?leuprolide (LUPRON DEPOT, 3-MONTH,) 3.75 MG injection     [provider]  ?Misc Natural Products (OSTEO BI-FLEX ADV DOUBLE ST PO) 1 tablet    [provider]  ?morphine (MSIR) 15 MG tablet Take 1 tablet (15 mg total) by mouth every 4 (four) hours as needed for severe pain. 04/06/21   Bruning, Ashlyn, PA-C  ?oxyCODONE (OXY IR/ROXICODONE) 5 MG immediate release tablet Take 5-10 mg by mouth every 6 (six) hours. 04/02/21   [provider]  ?predniSONE 5 MG/ML concentrated solution 1 ml    [provider]  ?   ? ?Allergies    ?Lisinopril, Olmesartan medoxomil-hctz, Ampicillin, Ciprofibrate, Ciprofloxacin, Erythromycin, Fd&c yellow #6 [yellow dye], and Other   ? ?Review of Systems   ?Review of Systems ? ?Physical Exam ?Updated Vital Signs ?BP (!) 145/66 (BP Location: Right Arm)   Pulse 70   Temp 98.5 ?F (36.9 ?C) (Oral)   Resp 18   Ht '5\' 6"'$  (1.676 m)   Wt 65.2 kg   SpO2 99%   BMI 23.20 kg/m?  ?Physical Exam ?Constitutional:   ?  General: He is not in acute distress. ?   Comments: Hard of hearing  ?HENT:  ?   Head: Normocephalic and atraumatic.  ?Eyes:  ?   Conjunctiva/sclera: Conjunctivae normal.  ?   Pupils: Pupils are equal, round, and reactive to light.  ?Cardiovascular:  ?   Rate and Rhythm: Normal rate and regular rhythm.  ?Pulmonary:  ?   Effort: Pulmonary effort is normal. No respiratory distress.  ?Abdominal:  ?   General: There is no distension.  ?   Tenderness: There is no abdominal tenderness.  ?Skin: ?   General: Skin is warm and dry.  ?Neurological:  ?   General: No focal deficit present.  ?   Mental Status: He is alert. Mental status is at baseline.  ?Psychiatric:     ?   Mood and Affect: Mood normal.     ?   Behavior: Behavior normal.  ? ? ?ED Results / Procedures / Treatments    ?Labs ?(all labs ordered are listed, but only abnormal results are displayed) ?Labs Reviewed  ?BASIC METABOLIC PANEL - Abnormal; Notable for the following components:  ?    Result Value  ? Glucose, Bld 131 (*)   ? All other components within normal limits  ?CBC WITH DIFFERENTIAL/PLATELET - Abnormal; Notable for the following components:  ? RBC 3.42 (*)   ? Hemoglobin 10.5 (*)   ? HCT 31.3 (*)   ? Neutro Abs 8.0 (*)   ? Lymphs Abs 0.6 (*)   ? All other components within normal limits  ?URINALYSIS, ROUTINE W REFLEX MICROSCOPIC - Abnormal; Notable for the following components:  ? APPearance HAZY (*)   ? Nitrite POSITIVE (*)   ? Leukocytes,Ua SMALL (*)   ? Bacteria, UA RARE (*)   ? All other components within normal limits  ?COMPREHENSIVE METABOLIC PANEL - Abnormal; Notable for the following components:  ? Glucose, Bld 128 (*)   ? Albumin 2.8 (*)   ? All other components within normal limits  ?CBC - Abnormal; Notable for the following components:  ? WBC 11.1 (*)   ? RBC 3.42 (*)   ? Hemoglobin 10.4 (*)   ? HCT 32.6 (*)   ? All other components within normal limits  ?RESP PANEL BY RT-PCR (FLU A&B, COVID) ARPGX2  ?URINE CULTURE  ?CK  ?VITAMIN B12  ?TROPONIN I (HIGH SENSITIVITY)  ?TROPONIN I (HIGH SENSITIVITY)  ? ? ?EKG ?EKG Interpretation ? ?Date/Time:  Thursday May 13 2021 11:02:39 EDT ?Ventricular Rate:  75 ?PR Interval:  201 ?QRS Duration: 100 ?QT Interval:  404 ?QTC Calculation: 452 ?R Axis:   -10 ?Text Interpretation: Sinus rhythm Left anterior fascicular block Probable anteroseptal infarct, old Confirmed by Octaviano Glow [phone]) on 05/13/2021 4:10:06 PM ? ?Radiology ?CT Thoracic Spine Wo Contrast ? ?Result Date: 05/13/2021 ?CLINICAL DATA:  Back trauma, no prior imaging (Age >= 16y) EXAM: CT THORACIC SPINE WITHOUT CONTRAST TECHNIQUE: Multidetector CT images of the thoracic were obtained using the standard protocol without intravenous contrast. RADIATION DOSE REDUCTION: This exam was performed according to the  departmental dose-optimization program which includes automated exposure control, adjustment of the mA and/or kV according to patient size and/or use of iterative reconstruction technique. COMPARISON:  CT 03/05/2020 FINDINGS: Alignment: Dextroconvex curvature of the thoracic spine. No static listhesis. Vertebrae: There is osseous metastatic disease involving the right transverse process and pedicle of T1, the T3 vertebral body and posterior elements on the left, T7 and left posterior elements, T11 vertebral body, and T12. There  is progressive height loss of T7 posteriorly in comparison to prior PET-CT. There is progressive height loss of the T7 vertebral body posteriorly, with up to 50% height loss, previously 25%. The T7 lesion encroaches upon the spinal canal and left neural foramen. Unchanged anterior height loss of T12. Other osseous metastatic lesions are similar. There is a new anterior compression fracture of T8, presumably pathologic, with up to 25% height loss. Paraspinal and other soft tissues: Small bilateral pleural effusions. Aortic atherosclerosis. Coronary artery calcifications. Disc levels: There is multilevel degenerative disc disease and facet arthropathy most severe the upper and lower thoracic spine and at T8-T9. IMPRESSION: Osseous metastatic disease in the thoracic spine with increased, progressive height loss of T7, now 50%, previously 25%. There is encroachment of the T7 metastatic lesion upon the spinal canal and left neural foramen. Other osseous metastatic lesions are similar to recent PET-CT. New anterior compression fracture of T8, presumably pathologic, with up to 25% height loss. Electronically Signed   By: Maurine Simmering M.D.   On: 05/13/2021 17:41  ? ?CT Lumbar Spine Wo Contrast ? ?Result Date: 05/13/2021 ?CLINICAL DATA:  Back trauma, no prior imaging (Age >= 16y); fall EXAM: CT LUMBAR SPINE WITHOUT CONTRAST TECHNIQUE: Multidetector CT imaging of the lumbar spine was performed without  intravenous contrast administration. Multiplanar CT image reconstructions were also generated. RADIATION DOSE REDUCTION: This exam was performed according to the departmental dose-optimization program which includes automa

## 2021-05-13 NOTE — ED Notes (Addendum)
Pt tried to stand, pt c/o lower back pain, pt unable to take a step. Pt states he feels to weak to walk. Dr. Langston Masker aware.  ?

## 2021-05-14 ENCOUNTER — Other Ambulatory Visit: Payer: Self-pay | Admitting: Radiation Oncology

## 2021-05-14 ENCOUNTER — Other Ambulatory Visit: Payer: Self-pay | Admitting: Radiation Therapy

## 2021-05-14 ENCOUNTER — Observation Stay (HOSPITAL_COMMUNITY): Payer: Medicare Other

## 2021-05-14 ENCOUNTER — Observation Stay (HOSPITAL_BASED_OUTPATIENT_CLINIC_OR_DEPARTMENT_OTHER): Payer: Medicare Other

## 2021-05-14 DIAGNOSIS — C7951 Secondary malignant neoplasm of bone: Secondary | ICD-10-CM | POA: Diagnosis not present

## 2021-05-14 DIAGNOSIS — R609 Edema, unspecified: Secondary | ICD-10-CM

## 2021-05-14 DIAGNOSIS — M48061 Spinal stenosis, lumbar region without neurogenic claudication: Secondary | ICD-10-CM | POA: Diagnosis not present

## 2021-05-14 DIAGNOSIS — J90 Pleural effusion, not elsewhere classified: Secondary | ICD-10-CM | POA: Diagnosis not present

## 2021-05-14 DIAGNOSIS — N3000 Acute cystitis without hematuria: Secondary | ICD-10-CM | POA: Diagnosis not present

## 2021-05-14 LAB — COMPREHENSIVE METABOLIC PANEL
ALT: 16 U/L (ref 0–44)
AST: 20 U/L (ref 15–41)
Albumin: 2.8 g/dL — ABNORMAL LOW (ref 3.5–5.0)
Alkaline Phosphatase: 73 U/L (ref 38–126)
Anion gap: 9 (ref 5–15)
BUN: 22 mg/dL (ref 8–23)
CO2: 24 mmol/L (ref 22–32)
Calcium: 8.9 mg/dL (ref 8.9–10.3)
Chloride: 103 mmol/L (ref 98–111)
Creatinine, Ser: 0.87 mg/dL (ref 0.61–1.24)
GFR, Estimated: >60 mL/min (ref >60)
Glucose, Bld: 128 mg/dL — ABNORMAL HIGH (ref 70–99)
Potassium: 3.8 mmol/L (ref 3.5–5.1)
Sodium: 136 mmol/L (ref 135–145)
Total Bilirubin: 0.7 mg/dL (ref 0.3–1.2)
Total Protein: 6.9 g/dL (ref 6.5–8.1)

## 2021-05-14 LAB — CBC
HCT: 32.6 % — ABNORMAL LOW (ref 39.0–52.0)
Hemoglobin: 10.4 g/dL — ABNORMAL LOW (ref 13.0–17.0)
MCH: 30.4 pg (ref 26.0–34.0)
MCHC: 31.9 g/dL (ref 30.0–36.0)
MCV: 95.3 fL (ref 80.0–100.0)
Platelets: 349 10*3/uL (ref 150–400)
RBC: 3.42 MIL/uL — ABNORMAL LOW (ref 4.22–5.81)
RDW: 14.9 % (ref 11.5–15.5)
WBC: 11.1 10*3/uL — ABNORMAL HIGH (ref 4.0–10.5)
nRBC: 0 % (ref 0.0–0.2)

## 2021-05-14 IMAGING — MR MR TOTAL SPINE METS SCREENING
9 of 19 series · 23 of 48 positions shown · IV contrast (gadavist)
Comparison: None Available.

CLINICAL DATA: Metastatic prostate carcinoma

EXAM:
MRI TOTAL SPINE WITHOUT AND WITH CONTRAST
TECHNIQUE: Multisequence MR imaging of the spine from the cervical spine to the
sacrum was performed prior to and following IV contrast
administration for evaluation of spinal metastatic disease.
CONTRAST:  6mL GADAVIST GADOBUTROL 1 MMOL/ML IV SOLN

[Series 16: T1 · sagittal · 4.0mm · 1.56mm/px · 1 of 18 slices shown (1 of 4)]
[im 1/18]
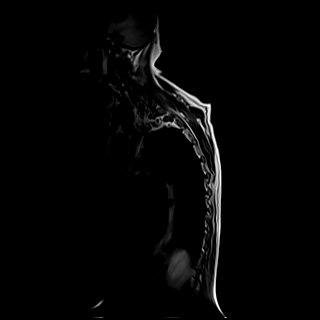

[Series 21: T1 · sagittal · 4.0mm · 0.70mm/px · 2 of 24 slices shown (2 of 4)]
[im 1/24]
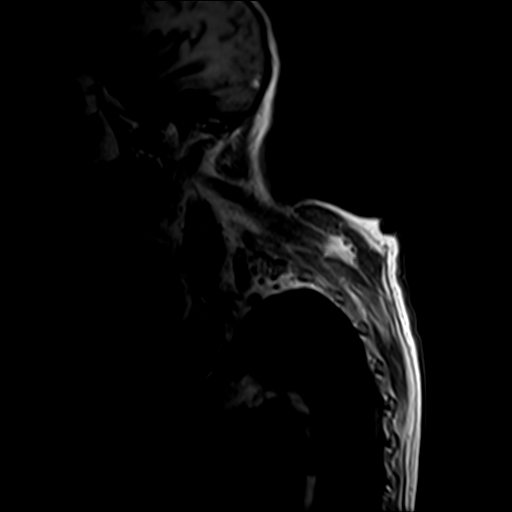
[im 24/24]
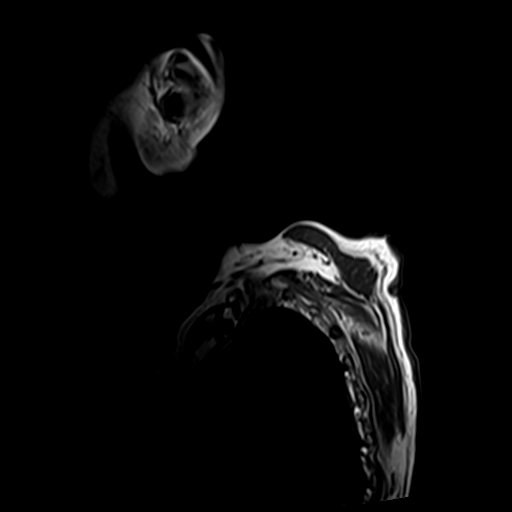

[Series 22: T1 · sagittal · 4.0mm · 0.74mm/px · 2 of 24 slices shown (3 of 4)]
[im 1/24]
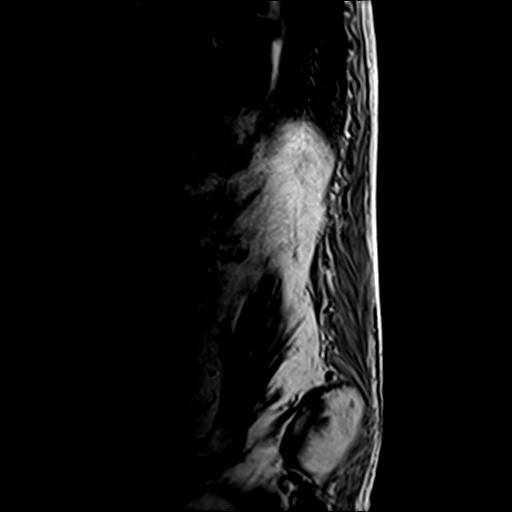
[im 24/24]
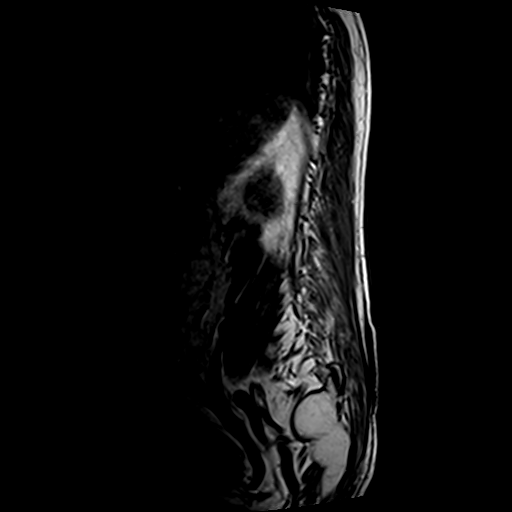

[Series 23: T1 · sagittal · 4.0mm · 0.70mm/px · 2 of 24 slices shown (4 of 4)]
[im 1/24]
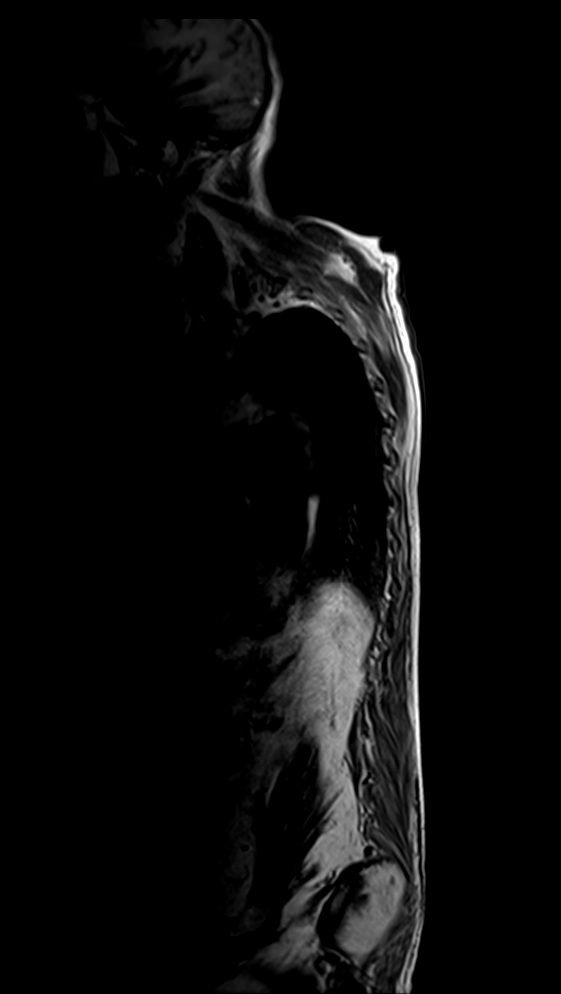
[im 24/24]
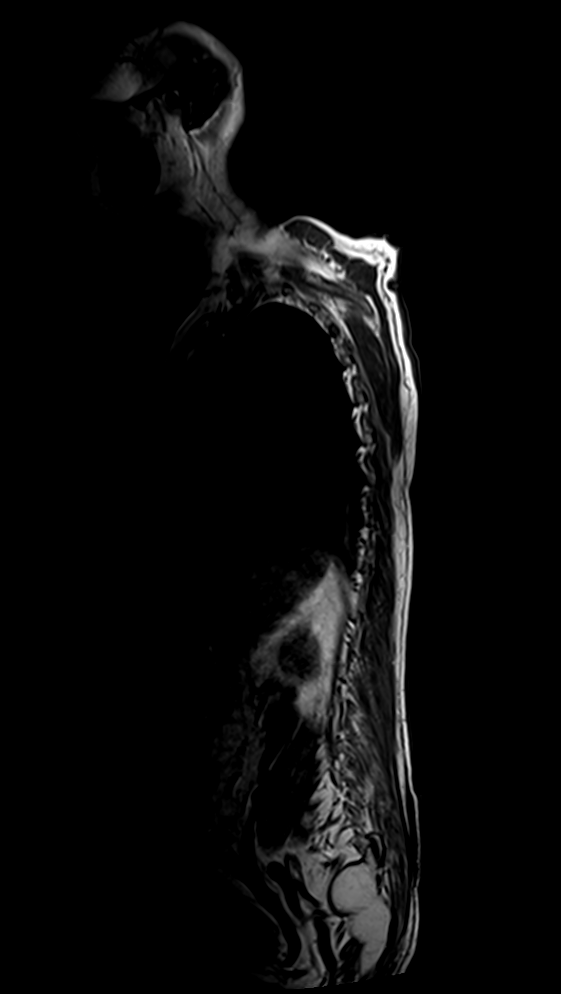

[Series 29: T2 post-contrast · sagittal · 4.0mm · 1.12mm/px · 2 of 24 slices shown (1 of 5)]
[im 1/24]
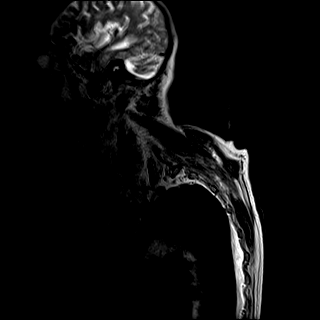
[im 24/24]
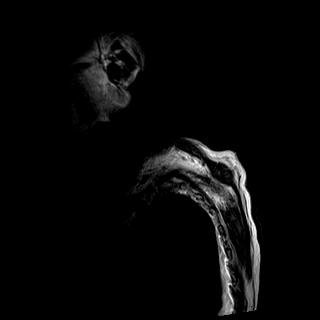

[Series 30: T2 post-contrast · sagittal · 4.0mm · 1.00mm/px · 2 of 24 slices shown (2 of 5)]
[im 1/24]
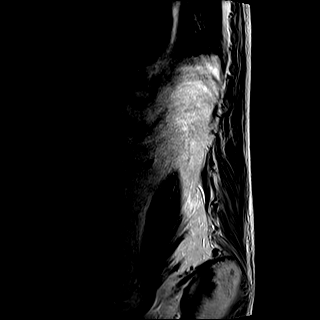
[im 24/24]
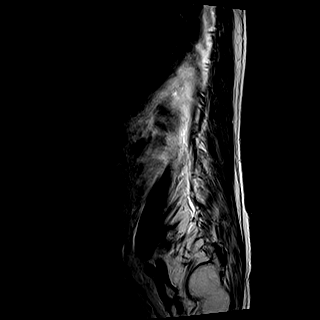

[Series 31: T2 post-contrast · sagittal · 4.0mm · 1.00mm/px · 2 of 23 slices shown (3 of 5)]
[im 1/23]
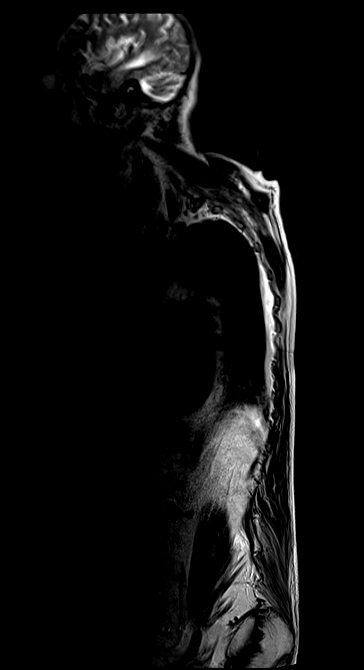
[im 23/23]
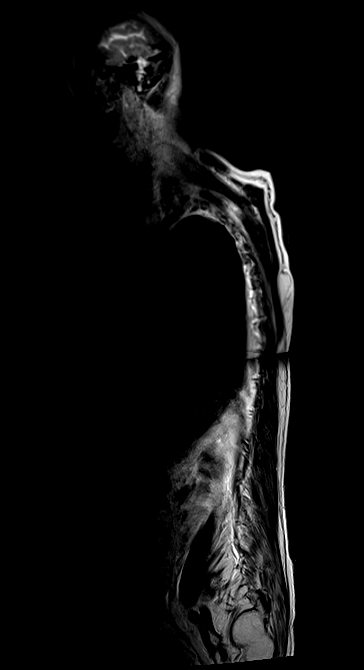

[Series 32: T2 post-contrast · sagittal · 4.0mm · 1.00mm/px · 2 of 23 slices shown (4 of 5)]
[im 1/23]
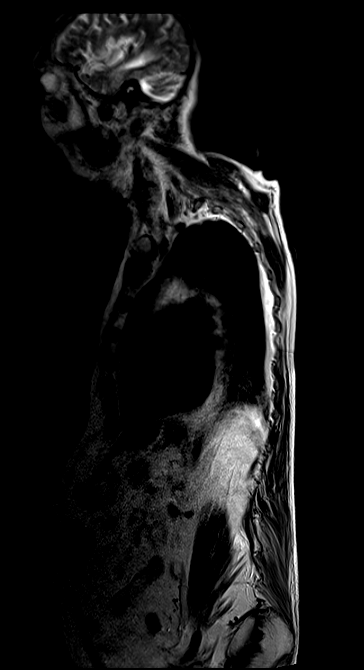
[im 23/23]
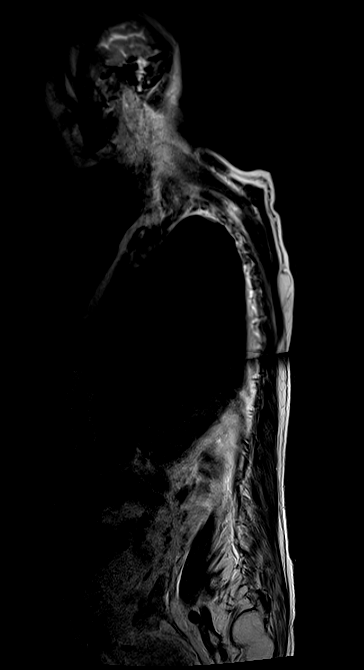

[Series 34: T2 post-contrast · axial · 4.0mm · 0.39mm/px · z∈[-440,+10]mm · 8 of 92 slices shown (5 of 5)]
[im 1/92]
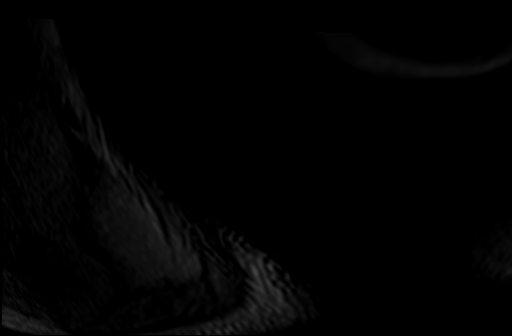
[im 14/92]
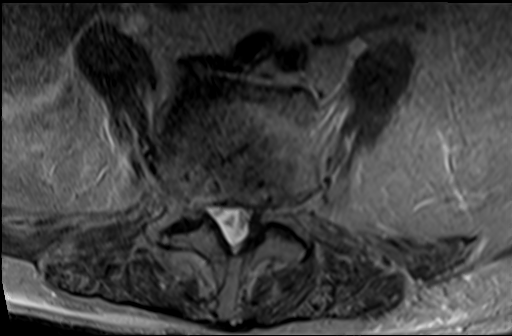
[im 27/92]
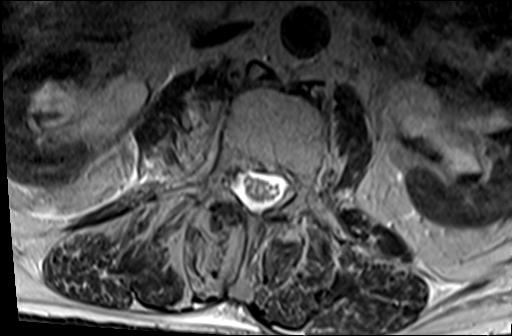
[im 40/92]
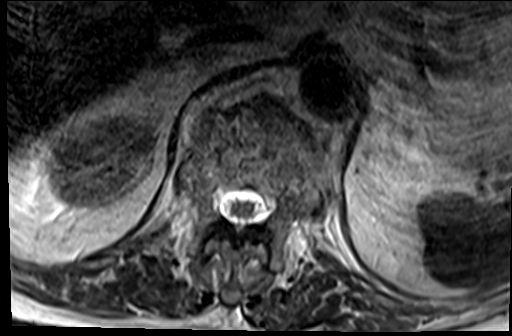
[im 53/92]
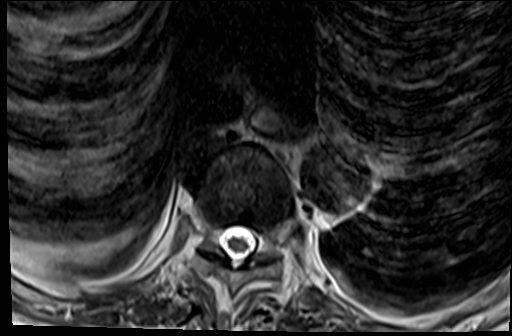
[im 66/92]
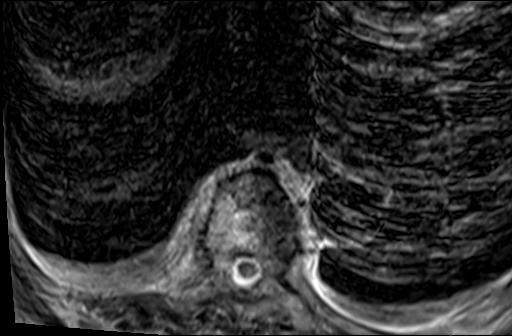
[im 79/92]
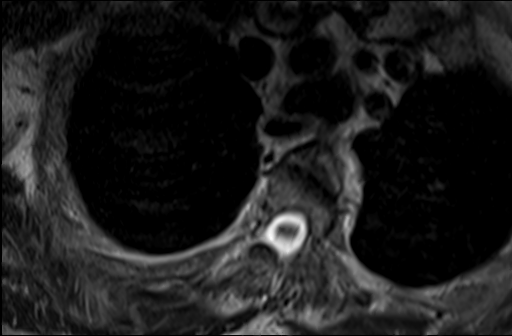
[im 92/92]
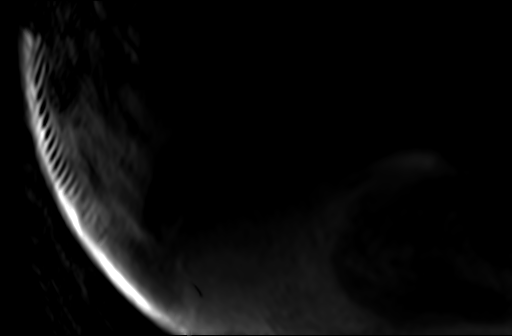

[23 of 48 positions shown; findings below may reference images not displayed]

FINDINGS: Alignment: Physiologic.

Vertebrae: There are osseous metastases at T3, T7, T8, T11 and T12.
contrast enhancing lesions extend into the right pedicles of T3 and
T8 in cause moderate attenuation of the thecal sac. There is
increased contrast enhancement along the dorsal aspect of T12.

Cord: Normal signal and morphology.

Paraspinal tissues: Small pleural effusions. Otherwise limited
visualization.

Disc levels:

Limited assessment of the disc levels due to motion degraded axial
imaging. There is at least moderate narrowing of the spinal canal at
T12-L1 and mild spinal canal stenosis at L1-2 and L2-3. There is
multilevel severe foraminal stenosis at the lower lumbar levels.
IMPRESSION: 1. Motion degraded study.
2. Osseous metastases at T3, T7, T8, T11 and T12.
3. Moderate spinal canal stenosis at T3-4 and T8 due to metastatic
disease.
4. Increased contrast enhancement along the dorsal aspect of the T12
vertebral body may indicate dilated venous plexus or epidural
extension of metastatic disease.
5. Multilevel severe foraminal stenosis at the lower lumbar levels.

## 2021-05-14 MED ORDER — ENOXAPARIN SODIUM 40 MG/0.4ML IJ SOSY
40.0000 mg | PREFILLED_SYRINGE | INTRAMUSCULAR | Status: DC
  Administered 2021-05-14 – 2021-05-17 (×4): 40 mg via SUBCUTANEOUS
  Filled 2021-05-14 (×4): qty 0.4

## 2021-05-14 MED ORDER — LOSARTAN POTASSIUM 50 MG PO TABS
100.0000 mg | ORAL_TABLET | Freq: Every day | ORAL | Status: DC
  Administered 2021-05-14 – 2021-05-18 (×5): 100 mg via ORAL
  Filled 2021-05-14 (×5): qty 2

## 2021-05-14 MED ORDER — VITAMIN B-12 1000 MCG PO TABS
1000.0000 ug | ORAL_TABLET | Freq: Every day | ORAL | Status: DC
  Administered 2021-05-14 – 2021-05-18 (×5): 1000 ug via ORAL
  Filled 2021-05-14 (×5): qty 1

## 2021-05-14 MED ORDER — GADOBUTROL 1 MMOL/ML IV SOLN
6.0000 mL | Freq: Once | INTRAVENOUS | Status: AC | PRN
  Administered 2021-05-14: 6 mL via INTRAVENOUS

## 2021-05-14 NOTE — Progress Notes (Incomplete)
Occupational Therapy Evaluation ? ? ?

## 2021-05-14 NOTE — [place] FL2 (Signed)
?[place] MEDICAID FL2 LEVEL OF CARE SCREENING TOOL  ?  ? ?IDENTIFICATION  ?Patient Name: ?Martin Foster Birthdate: 1925-12-13 Sex: male Admission Date (Current Location): ?05/13/2021  ?[place] and [place] Number: ? [place] ?  Facility and Address:  ?[facility],  [place], [place] ?     Provider Number: ?[id number]  ?Attending Physician Name and Address:  ?Elmarie Shiley, MD ? Relative Name and Phone Number:  ?  ?   ?Current Level of Care: ?Hospital Recommended Level of Care: ?[place] Prior Approval Number: ?  ? ?Date Approved/Denied: ?  PASRR Number: ?[id number] A ? ?Discharge Plan: ?SNF ?  ? ?Current Diagnoses: ?Patient Active Problem List  ? Diagnosis Date Noted  ? UTI (urinary tract infection) 05/13/2021  ? Weakness   ? 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 Anemia 04/05/2021  ? Disorder of the skin and subcutaneous tissue, unspecified 04/05/2021  ? Hyperglycemia 04/05/2021  ? Hyperlipidemia 04/05/2021  ? Low back pain 04/05/2021  ? Lumbar spondylosis 04/05/2021  ? Malignant melanoma of skin ([place]) 04/05/2021  ? Metabolic syndrome 16/60/6301  ? Nodular prostate without lower urinary tract symptoms 04/05/2021  ? Prediabetes 04/05/2021  ? Rectal bleeding 04/05/2021  ? Senile purpura ([place]) 04/05/2021  ? Sensorineural hearing loss 04/05/2021  ? Vitamin D deficiency 04/05/2021  ? Adenocarcinoma of rectum ([place]) 04/08/2020  ? Prostate cancer metastatic to bone ([place]) 04/08/2020  ? Hypertension 11/13/2019  ? Acute appendicitis 11/11/2019  ? Atherosclerosis of aorta ([place]) 11/11/2019  ? Rectal mass 11/11/2019  ? ? ?Orientation RESPIRATION BLADDER Height & Weight   ?  ?Self, Time, Situation, Place ? Normal Continent Weight: 65.2 kg ?Height:  '5\' 6"'$  (167.6 cm)  ?BEHAVIORAL SYMPTOMS/MOOD NEUROLOGICAL BOWEL NUTRITION STATUS  ?    Incontinent Diet (Regular)  ?AMBULATORY STATUS COMMUNICATION OF NEEDS Skin   ?Extensive Assist Verbally Skin abrasions (Right  middle knuckle abrasion) ?  ?  ?  ?    ?     ?     ? ? ?Personal Care Assistance Level of Assistance  ?Bathing, Feeding, Dressing Bathing Assistance: Limited assistance ?Feeding assistance: Independent ?Dressing Assistance: Limited assistance ?   ? ?Functional Limitations Info  ?Sight, Hearing, Speech Sight Info: Adequate ?Hearing Info: Impaired ?Speech Info: Adequate  ? ? ?SPECIAL CARE FACTORS FREQUENCY  ?OT (By licensed OT), PT (By licensed PT)   ?  ?PT Frequency: 5 x weekly ?OT Frequency: 5 x weekly ?  ?  ?  ?   ? ? ?Contractures Contractures Info: Not present  ? ? ?Additional Factors Info  ?Code Status, Allergies Code Status Info: DNR ?Allergies Info: Lisinopril, Olmesartan Medoxomil-hctz, Ampicillin, Aspartame And Phenylalanine, Ciprofibrate, Ciprofloxacin, Erythromycin, Fd&c Yellow #6 ([place]) ?  ?  ?  ?   ? ?Current Medications (05/14/2021):  This is the current hospital active medication list ?Current Facility-Administered Medications  ?Medication Dose Route Frequency Provider Last Rate Last Admin  ? 0.9 %  sodium chloride infusion  250 mL Intravenous PRN Regalado, Belkys A, MD      ? acetaminophen (TYLENOL) tablet 650 mg  650 mg Oral Q6H PRN Regalado, Belkys A, MD      ? Or  ? acetaminophen (TYLENOL) suppository 650 mg  650 mg Rectal Q6H PRN Regalado, Belkys A, MD      ? cefTRIAXone (ROCEPHIN) 2 g in sodium chloride 0.9 % 100 mL IVPB  2 g Intravenous Q24H Regalado, Belkys A, MD      ?  enoxaparin (LOVENOX) injection 40 mg  40 mg Subcutaneous Q24H Shade, Haze Justin, RPH      ? feeding supplement (ENSURE ENLIVE / ENSURE PLUS) liquid 237 mL  237 mL Oral BID BM Regalado, Belkys A, MD   237 mL at 05/14/21 1106  ? hydrALAZINE (APRESOLINE) injection 5 mg  5 mg Intravenous Q6H PRN Regalado, Belkys A, MD   5 mg at 05/14/21 1102  ? losartan (COZAAR) tablet 100 mg  100 mg Oral Daily Regalado, Belkys A, MD      ? morphine (PF) 2 MG/ML injection 2 mg  2 mg Intravenous Q4H PRN Regalado, Belkys A, MD      ? ondansetron  (ZOFRAN) tablet 4 mg  4 mg Oral Q6H PRN Regalado, Belkys A, MD      ? Or  ? ondansetron (ZOFRAN) injection 4 mg  4 mg Intravenous Q6H PRN Regalado, Belkys A, MD      ? oxyCODONE (Oxy IR/ROXICODONE) immediate release tablet 5-10 mg  5-10 mg Oral Q6H Regalado, Belkys A, MD   10 mg at 05/14/21 1138  ? polyethylene glycol (MIRALAX / GLYCOLAX) packet 17 g  17 g Oral Daily Regalado, Belkys A, MD   17 g at 05/14/21 1102  ? sodium chloride flush (NS) 0.9 % injection 3 mL  3 mL Intravenous Q12H Regalado, Belkys A, MD   3 mL at 05/14/21 1104  ? sodium chloride flush (NS) 0.9 % injection 3 mL  3 mL Intravenous PRN Regalado, Belkys A, MD      ? tamsulosin (FLOMAX) capsule 0.4 mg  0.4 mg Oral Daily Regalado, Belkys A, MD   0.4 mg at 05/14/21 1102  ? vitamin B-12 (CYANOCOBALAMIN) tablet 1,000 mcg  1,000 mcg Oral Daily Regalado, Belkys A, MD   1,000 mcg at 05/14/21 1103  ? ? ? ?Discharge Medications: ?Please see discharge summary for a list of discharge medications. ? ?Relevant Imaging Results: ? ?Relevant Lab Results: ? ? ?Additional Information ?SS# [id number]  Pfizer covid vaccine x 2 ? ?Rashaud Ybarbo, Marjie Skiff, RN ? ? ? ? ?

## 2021-05-14 NOTE — Progress Notes (Signed)
LLE venous duplex has been completed. ? ? ?Results can be found under chart review under CV PROC. ?05/14/2021 10:39 AM ?Pessy Delamar RVT, RDMS ? ?

## 2021-05-14 NOTE — Progress Notes (Signed)
?PROGRESS NOTE ? ? ? Martin Foster  UTM:[id number] DOB: 1925/01/26 DOA: 05/13/2021 ?PCP: [name], MD  ? ?Brief Narrative: ?Martin Foster is a 88 y.o. 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 Ander Slade  for radiation treatment, he completed 5 radiation treatments.  He presents today with inability to walk, due to pain and weakness in his legs and hip.  He presented initially confused in the ED due to severe pain.  After a dose of oral oxycodone his pain has improved, but he was not able to ambulate. ?  ?He was also found to have a UTI, he reported some dysuria and suprapubic pain.  UA with significant nitrates and 21-50 white blood cells. ?  ? ?Assessment & Plan: ?  ?Principal Problem: ?  UTI (urinary tract infection) ?Active Problems: ?  Prostate cancer metastatic to bone [facility]) ?  Hypertension ?  Weakness ? ?  ?  ?1-UTI: ?Patient presented with dysuria, UA with positive nitrates and 21-50 white blood cell. ?Follow urine culture. ?Continue With IV ceftriaxone. ?  ?2-Weakness, inability to ambulate: ?CT spine showed worsening compression fracture T 7 and new compression fracture T 8.  ?PT evaluation, recommending SNF>  ?B; 12: 361. ?  ?3-Metastatic prostate cancer to T7; New T 8 compression fracture.  ?He completed 5 treatments of radiation followed by Dr. Tammi Klippel ?Continue with oxycodone as needed. ?Continue with Flomax.  ?CT Thoracic: Osseous metastatic disease in the thoracic spine with increased, ? progressive height loss of T7, now 50%, previously 25%. There is encroachment of the T7 metastatic lesion upon the spinal canal and left neural foramen. Other osseous metastatic lesions are similar to recent PET-CT. New anterior compression fracture of T8, presumably pathologic, with ?up to 25% height loss. ?Discussed CT result with Dr Tammi Klippel. He recommend MRI. Further recommendation depending on results.  ?  ?4-Hypertension: PRN hydralazine order.  ?On  Cozaar.  ? ?5-Left lower extremity edema: Doppler Negative for DVT ? ? ?Estimated body mass index is 23.2 kg/m? as calculated from the following: ?  Height as of this encounter: '5\' 6"'$  (1.676 m). ?  Weight as of this encounter: 65.2 kg. ? ? ?DVT prophylaxis: Lovenox ?Code Status: DNR ?Family Communication: Crae discussed with daughter  ?Disposition Plan:  ?Status is: Observation ?The patient remains OBS appropriate and will d/c before 2 midnights. ? ? ? ?Consultants:  ?None ? ?Procedures:  ?None ? ?Antimicrobials:  ?Ceftriaxone ? ?Subjective: ?He report back pain, taking meds.  ? ? ?Objective: ?Vitals:  ? 05/14/21 0526 05/14/21 0752 05/14/21 1010 05/14/21 1015  ?BP: (!) 151/70 (!) 145/66 (!) 184/82 (!) 186/91  ?Pulse: 66 70    ?Resp: 18 18    ?Temp: 98 ?F (36.7 ?C) 98.5 ?F (36.9 ?C)    ?TempSrc: Oral Oral    ?SpO2: 97% 99% 99%   ?Weight:      ?Height:      ? ? ?Intake/Output Summary (Last 24 hours) at 05/14/2021 1416 ?Last data filed at 05/13/2021 1504 ?Gross per 24 hour  ?Intake 100 ml  ?Output --  ?Net 100 ml  ? ?Filed Weights  ? 05/13/21 1939  ?Weight: 65.2 kg  ? ? ?Examination: ? ?General exam: Appears calm and comfortable  ?Respiratory system: Clear to auscultation. Respiratory effort normal. ?Cardiovascular system: S1 & S2 heard, RRR. No JVD, murmurs, rubs, gallops or clicks. No pedal edema. ?Gastrointestinal system: Abdomen is nondistended, soft and nontender. No organomegaly or masses felt. Normal  bowel sounds heard. ?Central nervous system: Alert and oriented. Moves all 4 extremities.  ?Extremities: Symmetric 5 x 5 power. ? ? ?Data Reviewed: I have personally reviewed following labs and imaging studies ? ?CBC: ?Recent Labs  ?Lab 05/13/21 ?1226 05/14/21 ?0543  ?WBC 9.4 11.1*  ?NEUTROABS 8.0*  --   ?HGB 10.5* 10.4*  ?HCT 31.3* 32.6*  ?MCV 91.5 95.3  ?PLT 336 349  ? ?Basic Metabolic Panel: ?Recent Labs  ?Lab 05/13/21 ?1226 05/14/21 ?0543  ?NA 135 136  ?K 3.8 3.8  ?CL 102 103  ?CO2 25 24  ?GLUCOSE 131* 128*  ?BUN 18  22  ?CREATININE 0.69 0.87  ?CALCIUM 9.1 8.9  ? ?GFR: ?Estimated Creatinine Clearance: 46.9 mL/min (by C-G formula based on SCr of 0.87 mg/dL). ?Liver Function Tests: ?Recent Labs  ?Lab 05/14/21 ?0543  ?AST 20  ?ALT 16  ?ALKPHOS 73  ?BILITOT 0.7  ?PROT 6.9  ?ALBUMIN 2.8*  ? ?No results for input(s): LIPASE, AMYLASE in the last 168 hours. ?No results for input(s): AMMONIA in the last 168 hours. ?Coagulation Profile: ?No results for input(s): INR, PROTIME in the last 168 hours. ?Cardiac Enzymes: ?Recent Labs  ?Lab 05/13/21 ?1226  ?CKTOTAL 165  ? ?BNP (last 3 results) ?No results for input(s): PROBNP in the last 8760 hours. ?HbA1C: ?No results for input(s): HGBA1C in the last 72 hours. ?CBG: ?No results for input(s): GLUCAP in the last 168 hours. ?Lipid Profile: ?No results for input(s): CHOL, HDL, LDLCALC, TRIG, CHOLHDL, LDLDIRECT in the last 72 hours. ?Thyroid Function Tests: ?No results for input(s): TSH, T4TOTAL, FREET4, T3FREE, THYROIDAB in the last 72 hours. ?Anemia Panel: ?Recent Labs  ?  05/13/21 ?2009  ?VITAMINB12 361  ? ?Sepsis Labs: ?No results for input(s): PROCALCITON, LATICACIDVEN in the last 168 hours. ? ?Recent Results (from the past 240 hour(s))  ?Resp Panel by RT-PCR (Flu A&B, Covid) Nasopharyngeal Swab     Status: None  ? Collection Time: 05/13/21 12:13 PM  ? Specimen: Nasopharyngeal Swab; Nasopharyngeal(NP) swabs in vial transport medium  ?Result Value Ref Range Status  ? SARS Coronavirus 2 by RT PCR NEGATIVE NEGATIVE Final  ?  Comment: (NOTE) ?SARS-CoV-2 target nucleic acids are NOT DETECTED. ? ?The SARS-CoV-2 RNA is generally detectable in upper respiratory ?specimens during the acute phase of infection. The lowest ?concentration of SARS-CoV-2 viral copies this assay can detect is ?138 copies/mL. A negative result does not preclude SARS-Cov-2 ?infection and should not be used as the sole basis for treatment or ?other patient management decisions. A negative result may occur with  ?improper specimen  collection/handling, submission of specimen other ?than nasopharyngeal swab, presence of viral mutation(s) within the ?areas targeted by this assay, and inadequate number of viral ?copies(<138 copies/mL). A negative result must be combined with ?clinical observations, patient history, and epidemiological ?information. The expected result is Negative. ? ?Fact Sheet for Patients:  ?EntrepreneurPulse.com.au ? ?Fact Sheet for Healthcare Providers:  ?IncredibleEmployment.be ? ?This test is no t yet approved or cleared by the [place] FDA and  ?has been authorized for detection and/or diagnosis of SARS-CoV-2 by ?FDA under an Emergency Use Authorization (EUA). This EUA will remain  ?in effect (meaning this test can be used) for the duration of the ?COVID-19 declaration under Section 564(b)(1) of the Act, 21 ?U.S.C.section 360bbb-3(b)(1), unless the authorization is terminated  ?or revoked sooner.  ? ? ?  ? Influenza A by PCR NEGATIVE NEGATIVE Final  ? Influenza B by PCR NEGATIVE NEGATIVE Final  ?  Comment: (NOTE) ?  The Xpert Xpress SARS-CoV-2/FLU/RSV plus assay is intended as an aid ?in the diagnosis of influenza from Nasopharyngeal swab specimens and ?should not be used as a sole basis for treatment. Nasal washings and ?aspirates are unacceptable for Xpert Xpress SARS-CoV-2/FLU/RSV ?testing. ? ?Fact Sheet for Patients: ?EntrepreneurPulse.com.au ? ?Fact Sheet for Healthcare Providers: ?IncredibleEmployment.be ? ?This test is not yet approved or cleared by the [place] FDA and ?has been authorized for detection and/or diagnosis of SARS-CoV-2 by ?FDA under an Emergency Use Authorization (EUA). This EUA will remain ?in effect (meaning this test can be used) for the duration of the ?COVID-19 declaration under Section 564(b)(1) of the Act, 21 U.S.C. ?section 360bbb-3(b)(1), unless the authorization is terminated or ?revoked. ? ?Performed at [facility], [address] ?[place], [place] [address] ?  ?  ? ? ? ? ? ?Radiology Studies: ?CT Thoracic Spine Wo Contrast ? ?Result Date: 05/13/2021 ?CLINICAL DATA:  Back trauma, no prior i

## 2021-05-14 NOTE — TOC Initial Note (Signed)
Transition of Care (TOC) - Initial/Assessment Note  ? ? ?Patient Details  ?Name: Martin Foster ?MRN: [id number] ?Date of Birth: 20-Nov-1925 ? ?Transition of Care (TOC) CM/SW Contact:    ?Batya Citron, Marjie Skiff, RN ?Phone Number: ?05/14/2021, 2:06 PM ? ?Clinical Narrative:                 ?Spoke with pt at bedside for dc planning. Physical therapy recommendations reviewed. Pt states he is open to the option of SNF but would like me to speak with his wife about it. Spoke with pt wife about SNF via phone. She states that SNF would be a good idea. She states that her daughter is at [place] right now and she would be ok with him going there. She would like to hear all the bed options though. FL2 faxed out to area SNFs. TOC will provide bed offers to wife when available. ? ?Expected Discharge Plan: [place] ?Barriers to Discharge: Continued Medical Work up ? ? ?Patient Goals and CMS Choice ?Patient states their goals for this hospitalization and ongoing recovery are:: To get moving better ?  ?  ? ?Expected Discharge Plan and Services ?Expected Discharge Plan: [place] ?  ?Discharge Planning Services: CM Consult ?  ?Living arrangements for the past 2 months: [place] ?                ?  ?Prior Living Arrangements/Services ?Living arrangements for the past 2 months: [place] ?Lives with:: Spouse ?Patient language and need for interpreter reviewed:: Yes ?       ?Need for Family Participation in Patient Care: Yes (Comment) ?Care giver support system in place?: Yes (comment) ?  ?Criminal Activity/Legal Involvement Pertinent to Current Situation/Hospitalization: No - Comment as needed ? ?Activities of Daily Living ?Home Assistive Devices/Equipment: Hearing aid, [place] Rile (specify type) (bilateral hearing aides) ?ADL Screening (condition at time of admission) ?Patient's cognitive ability adequate to safely complete daily activities?: Yes ?Is the patient deaf or have difficulty hearing?:  Yes ?Does the patient have difficulty seeing, even when wearing glasses/contacts?: No ?Does the patient have difficulty concentrating, remembering, or making decisions?: Yes ?Patient able to express need for assistance with ADLs?: Yes ?Does the patient have difficulty dressing or bathing?: Yes ?Independently performs ADLs?: No ?Communication: Independent ?Dressing (OT): Needs assistance ?Is this a change from baseline?: Change from baseline, expected to last >3 days ?Grooming: Independent ?Feeding: Independent ?Bathing: Needs assistance ?Is this a change from baseline?: Change from baseline, expected to last >3 days ?Toileting: Needs assistance ?Is this a change from baseline?: Change from baseline, expected to last >3days ?In/Out Bed: Needs assistance ?Is this a change from baseline?: Change from baseline, expected to last >3 days ?Walks in Home: Needs assistance ?Is this a change from baseline?: Change from baseline, expected to last >3 days ?Does the patient have difficulty walking or climbing stairs?: Yes ?Weakness of Legs: Both ?Weakness of Arms/Hands: Both ? ? ?Emotional Assessment ?Appearance:: Appears stated age ?Attitude/Demeanor/Rapport: Gracious ?Affect (typically observed): Calm ?Orientation: : Oriented to Self, Oriented to Place, Oriented to  Time, Oriented to Situation ?Alcohol / Substance Use: Not Applicable ?Psych Involvement: No (comment) ? ?Admission diagnosis:  UTI (urinary tract infection) [N39.0] ?Patient Active Problem List  ? Diagnosis Date Noted  ? UTI (urinary tract infection) 05/13/2021  ? Weakness   ? 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 Anemia 04/05/2021  ? Disorder of the skin and subcutaneous tissue, unspecified 04/05/2021  ?  Hyperglycemia 04/05/2021  ? Hyperlipidemia 04/05/2021  ? Low back pain 04/05/2021  ? Lumbar spondylosis 04/05/2021  ? Malignant melanoma of skin ([place]) 04/05/2021  ? Metabolic syndrome 87/56/4332  ? Nodular prostate without lower urinary tract  symptoms 04/05/2021  ? Prediabetes 04/05/2021  ? Rectal bleeding 04/05/2021  ? Senile purpura ([place]) 04/05/2021  ? Sensorineural hearing loss 04/05/2021  ? Vitamin D deficiency 04/05/2021  ? Adenocarcinoma of rectum ([place]) 04/08/2020  ? Prostate cancer metastatic to bone ([place]) 04/08/2020  ? Hypertension 11/13/2019  ? Acute appendicitis 11/11/2019  ? Atherosclerosis of aorta ([place]) 11/11/2019  ? Rectal mass 11/11/2019  ? ?PCP:  [name], MD ?Pharmacy:   ?[id number] [address]?Phone: [phone]: [phone]? ? ? ?Readmission Risk Interventions ?   ? View : No data to display.  ?  ?  ?  ? ? ? ?

## 2021-05-14 NOTE — Evaluation (Signed)
Occupational Therapy Evaluation ?Patient Details ?Name: Martin Foster ?MRN: [id number] ?DOB: [birth year] ?Today's Date: 05/14/2021 ? ? ?History of Present Illness Pt is a 88yo male presenting to [facility] ED on 05/13/21 with inability to walk with  pain and weakness in BLEs and hip, fell at home in bathroom and called 911 to come to [facility]. CT 5/4 found metastatic disease in T7 with L foraminal compression and new anterior compression fracture of T8. Found to have UTI. Of note: currently undergoing radiation.  PMH: HTN, hx of prostate and rectal cancer.  ? ?Clinical Impression ?  ?Patient lives at home with spouse and ambulates with rolling walker or walking stick, independent with self care tasks. Currently patient is light min A for sit to stand from recliner and taking steps forward and back with walker. With legs elevated in recliner patient demonstrates ability to pull up socks, wash lower legs and wash upper body. Anticipate with continued mobility that patient will be able to progress home with spouse/family support and home health services. Acute OT to follow.  ?   ? ?Recommendations for follow up therapy are one component of a multi-disciplinary discharge planning process, led by the attending physician.  Recommendations may be updated based on patient status, additional functional criteria and insurance authorization.  ? ?Follow Up Recommendations ? Home health OT  ?  ?Assistance Recommended at Discharge Frequent or constant Supervision/Assistance  ?Patient can return home with the following A little help with walking and/or transfers;A little help with bathing/dressing/bathroom;Help with stairs or ramp for entrance;Assistance with cooking/housework ? ?  ?Functional Status Assessment ? Patient has had a recent decline in their functional status and demonstrates the ability to make significant improvements in function in a reasonable and predictable amount of time.  ?Equipment Recommendations ? Tub/shower seat  ?  ?    ?Precautions / Restrictions Precautions ?Precautions: Fall ?Precaution Comments: Recent fall that lead to this hospital admission ?Restrictions ?Weight Bearing Restrictions: No  ? ?  ? ?Mobility Bed Mobility ?  ?  ?  ?  ?  ?  ?  ?General bed mobility comments: In recliner ?  ? ? ? ?  ?Balance Overall balance assessment: Needs assistance, History of Falls ?Sitting-balance support: Feet supported ?Sitting balance-Leahy Scale: Good ?  ?  ?Standing balance support: Reliant on assistive device for balance ?Standing balance-Leahy Scale: Poor ?  ?  ?  ?  ?  ?  ?  ?  ?  ?  ?  ?  ?   ? ?ADL either performed or assessed with clinical judgement  ? ?ADL Overall ADL's : Needs assistance/impaired ?Eating/Feeding: Independent;Sitting ?Eating/Feeding Details (indicate cue type and reason): To drink coffee ?Grooming: Dance movement psychotherapist;Wash/dry hands;Set up;Sitting ?  ?Upper Body Bathing: Set up;Sitting ?Upper Body Bathing Details (indicate cue type and reason): Patient able to wash arms / axilla area seated in chair ?Lower Body Bathing: Minimal assistance;Sitting/lateral leans ?Lower Body Bathing Details (indicate cue type and reason): With legs extended in recliner patient is able to wash legs ?Upper Body Dressing : Set up;Sitting ?  ?Lower Body Dressing: Minimal assistance;Sitting/lateral leans;Sit to/from stand ?Lower Body Dressing Details (indicate cue type and reason): With legs extended on recliner patient is able to pull up socks ?Toilet Transfer: Minimal assistance ?Toilet Transfer Details (indicate cue type and reason): Patient is able to power up to standing from recliner chair and with light min A take steps forward/back from recliner. ?Toileting- Clothing Manipulation and Hygiene: Minimal assistance;Sit to/from stand;Sitting/lateral lean ?  ?  ?  ?  Functional mobility during ADLs: Minimal assistance;Rolling walker (2 wheels) ?General ADL Comments: Patient appears close to his baseline, light min A for safety   ? ? ? ? ?Pertinent Vitals/Pain Pain Assessment ?Pain Assessment: Faces ?Faces Pain Scale: Hurts little more ?Pain Location: back ?Pain Descriptors / Indicators: Discomfort, Guarding ?Pain Intervention(s): Monitored during session, Limited activity within patient's tolerance  ? ? ? ?Hand Dominance Right ?  ?Extremity/Trunk Assessment Upper Extremity Assessment ?Upper Extremity Assessment: Overall WFL for tasks assessed ?  ?Lower Extremity Assessment ?Lower Extremity Assessment: Defer to PT evaluation ? ?  ?Cervical / Trunk Assessment ?Cervical / Trunk Assessment: Other exceptions ?Cervical / Trunk Exceptions: T8 compression fx ?  ?Communication Communication ?Communication: HOH (hearing aid L ear) ?  ?Cognition Arousal/Alertness: Awake/alert ?Behavior During Therapy: [facility] for tasks assessed/performed ?Overall Cognitive Status: Within Functional Limits for tasks assessed ?  ?  ?  ?  ?  ?  ?  ?  ?  ?  ?  ?  ?  ?  ?  ?  ?General Comments: Patient is very [place] however able to follow directions appropriately if speak into L ear ?  ?  ?   ?   ?   ? ? ?Home Living Family/patient expects to be discharged to:: Private residence ?Living Arrangements: Spouse/significant other ?Available Help at Discharge: Family;Available 24 hours/day ?Type of Home: House ?Home Access: Stairs to enter ?Entrance Stairs-Number of Steps: 4 ?Entrance Stairs-Rails: Left ?Home Layout: Laundry or work area in basement;One level ?  ?  ?Bathroom Shower/Tub: Walk-in shower ?  ?Bathroom Toilet: Standard ?  ?  ?Home Equipment: Conservation officer, nature (2 wheels);Cane - single point ?  ?  ?  ? ?  ?Prior Functioning/Environment Prior Level of Function : Independent/Modified Independent;History of Falls (last six months) ?  ?  ?  ?  ?  ?  ?Mobility Comments: uses RW and walking stick for mobility. Pt reports no other falls in the past three months, though endorses fear of falling. ?ADLs Comments: IND ?  ? ?  ?  ?OT Problem List: Decreased activity tolerance;Impaired  balance (sitting and/or standing);Decreased safety awareness;Pain ?  ?   ?OT Treatment/Interventions: Self-care/ADL training;Balance training;Patient/family education;Therapeutic activities;DME and/or AE instruction  ?  ?OT Goals(Current goals can be found in the care plan section) Acute Rehab OT Goals ?Patient Stated Goal: Home with spouse ?OT Goal Formulation: With patient ?Time For Goal Achievement: 05/28/21 ?Potential to Achieve Goals: Good  ?OT Frequency: Min 2X/week ?  ? ?   ?AM-PAC OT "6 Clicks" Daily Activity     ?Outcome Measure Help from another person eating meals?: None ?Help from another person taking care of personal grooming?: A Little ?Help from another person toileting, which includes using toliet, bedpan, or urinal?: A Little ?Help from another person bathing (including washing, rinsing, drying)?: A Little ?Help from another person to put on and taking off regular upper body clothing?: A Little ?Help from another person to put on and taking off regular lower body clothing?: A Little ?6 Click Score: 19 ?  ?End of Session Equipment Utilized During Treatment: Rolling walker (2 wheels) ?Nurse Communication: Mobility status ? ?Activity Tolerance: Patient tolerated treatment well ?Patient left: in chair;with call bell/phone within reach;with chair alarm set ? ?OT Visit Diagnosis: Unsteadiness on feet (R26.81);History of falling (Z91.81)  ?              ?Time: 6948-5462 ?OT Time Calculation (min): 20 min ?Charges:  OT General Charges ?$OT Visit: 1 Visit ?OT  Evaluation ?$OT Eval Low Complexity: 1 Low ? ?Delbert Phenix OT ?OT pager: [phone] ? ?Rosemary Holms ?05/14/2021, 2:16 PM ?

## 2021-05-14 NOTE — Evaluation (Signed)
Physical Therapy Evaluation ?Patient Details ?Name: Martin Foster ?MRN: [id number] ?DOB: 1925-05-10 ?Today's Date: 05/14/2021 ? ?History of Present Illness ? Pt is a 88yo male presenting [facility] ED on 05/13/21 with inability to walk with  pain and weakness in BLEs and hip, fell at home in bathroom and called 911 to come to [facility]. CT 5/4 found metastatic disease in T7 with L foraminal compression and new anterior compression fracture of T8. Found to have UTI. Of note: currently undergoing radiation.  PMH: HTN, hx of prostate and rectal cancer. ?  ?Clinical Impression ? Pt presents with the problems listed above and functional impairments listed below. Pt is VERY HOH but otherwise Aox4. Pt min assist for bed mobility and min guard for short-distance in-room ambulation limited by fatigue and pt reporting cold with BUE shaking making further ambulation unsafe. Pt's functional status combined with recent fall and level of assist at home leads this therapist to recommend SNF-level therapies upon discharge. We will continue to follow acutely to promote safe discharge to the destination below.     ?   ? ?Recommendations for follow up therapy are one component of a multi-disciplinary discharge planning process, led by the attending physician.  Recommendations may be updated based on patient status, additional functional criteria and insurance authorization. ? ?Follow Up Recommendations Skilled nursing-short term rehab (<3 hours/day) ? ?  ?Assistance Recommended at Discharge Frequent or constant Supervision/Assistance  ?Patient can return home with the following ? Help with stairs or ramp for entrance;Assist for transportation;Assistance with cooking/housework;A little help with bathing/dressing/bathroom;A little help with walking and/or transfers ? ?  ?Equipment Recommendations None recommended by PT (Pt has recommended DME)  ?Recommendations for Other Services ?    ?  ?Functional Status Assessment Patient has had a recent decline in  their functional status and demonstrates the ability to make significant improvements in function in a reasonable and predictable amount of time.  ? ?  ?Precautions / Restrictions Precautions ?Precautions: Fall ?Precaution Comments: Recent fall that lead to this hospital admission ?Restrictions ?Weight Bearing Restrictions: No  ? ?  ? ?Mobility ? Bed Mobility ?Overal bed mobility: Needs Assistance ?Bed Mobility: Rolling, Sidelying to Sit ?Rolling: Min assist ?Sidelying to sit: Min assist ?  ?  ?  ?General bed mobility comments: Pt min assist for trunk assist during rolling and trunk elevation during sidelying to sit, otherwise min guard. Pt sat EOB for ~60mn while MD examined him. ?  ? ?Transfers ?Overall transfer level: Needs assistance ?Equipment used: Rolling walker (2 wheels) ?Transfers: Sit to/from Stand ?Sit to Stand: Min assist ?  ?  ?  ?  ?  ?General transfer comment: Min assist for steadying of RW, VCs to push off bed with BUE ?  ? ?Ambulation/Gait ?Ambulation/Gait assistance: Min guard ?Gait Distance (Feet): 5 Feet ?Assistive device: Rolling walker (2 wheels) ?Gait Pattern/deviations: Step-to pattern, Decreased stride length, Decreased dorsiflexion - right, Decreased dorsiflexion - left, Shuffle, Trunk flexed, Narrow base of support ?Gait velocity: decreased ?  ?  ?General Gait Details: Pt ambulated EOB toward door with RW and min guard, no physical assist required, no overt LOB. Demonstrated shuffle and narrow BOS with trunk flexed. ambulation limited by fatigue. During ambulation BUE began trembling and pt reporting "I'm very cold!" further mobility deferred. ? ?Stairs ?  ?  ?  ?  ?  ? ?Wheelchair Mobility ?  ? ?Modified Rankin (Stroke Patients Only) ?  ? ?  ? ?Balance Overall balance assessment: Needs assistance ?Sitting-balance  support: Feet supported, Bilateral upper extremity supported ?Sitting balance-Leahy Scale: Poor ?  ?  ?Standing balance support: Reliant on assistive device for balance, During  functional activity, Bilateral upper extremity supported ?Standing balance-Leahy Scale: Poor ?  ?  ?  ?  ?  ?  ?  ?  ?  ?  ?  ?  ?   ? ? ? ?Pertinent Vitals/Pain Pain Assessment ?Pain Assessment: 0-10 ?Pain Score: 3  ?Pain Location: back ?Pain Descriptors / Indicators: Discomfort, Guarding ?Pain Intervention(s): Limited activity within patient's tolerance, Monitored during session, Repositioned  ? ? ?Home Living Family/patient expects to be discharged to:: Private residence ?Living Arrangements: Spouse/significant other ?Available Help at Discharge: Family;Available 24 hours/day (Wife [name]) ?Type of Home: House ?Home Access: Stairs to enter ?Entrance Stairs-Rails: Left ?Entrance Stairs-Number of Steps: 4 ?  ?Home Layout: Laundry or work area in basement;One level (15 steps, railing on L to go into basement) ?Home Equipment: Conservation officer, nature (2 wheels);Cane - single point ?   ?  ?Prior Function Prior Level of Function : Independent/Modified Independent;History of Falls (last six months) ?  ?  ?  ?  ?  ?  ?Mobility Comments: uses RW and walking stick for mobility. Pt reports no other falls in the past three months, though endorses fear of falling. ?ADLs Comments: IND ?  ? ? ?Hand Dominance  ?   ? ?  ?Extremity/Trunk Assessment  ? Upper Extremity Assessment ?Upper Extremity Assessment: Defer to OT evaluation;Overall [facility] for tasks assessed (Functional ROM, gross MMT 4/5) ?  ? ?Lower Extremity Assessment ?Lower Extremity Assessment: RLE deficits/detail;LLE deficits/detail ?RLE Deficits / Details: MMT grossly 3+/5, functional ROM ?RLE Sensation: WNL ?LLE Deficits / Details: MMT grossly 3+/5, functional ROM ?LLE Sensation: WNL ?  ? ?Cervical / Trunk Assessment ?Cervical / Trunk Assessment: Other exceptions ?Cervical / Trunk Exceptions: T8 compression fx  ?Communication  ? Communication: HOH (Wears hearing aid in L ear, removal of mask and written questions aided communication)  ?Cognition Arousal/Alertness:  Awake/alert ?Behavior During Therapy: [facility] for tasks assessed/performed ?Overall Cognitive Status: Within Functional Limits for tasks assessed ?  ?  ?  ?  ?  ?  ?  ?  ?  ?  ?  ?  ?  ?  ?  ?  ?General Comments: Pt AOx4, VERY HOH uses hearing aid in left ear, removal of mask and written communication works well. Reads lips. ?  ?  ? ?  ?General Comments General comments (skin integrity, edema, etc.): Pt's BP elevated, RN notified, see vitals flowsheets. Bilateral knees presenting with mottled appearance, pt reports RN aware, vascular impression reports no DVTs. ? ?  ?Exercises Total Joint Exercises ?Ankle Circles/Pumps: AROM, Both, 5 reps ?Heel Slides: AROM, Both, 5 reps ?Long Arc Quad: AROM, Both, 5 reps  ? ?Assessment/Plan  ?  ?PT Assessment Patient needs continued PT services  ?PT Problem List Decreased strength;Decreased range of motion;Decreased activity tolerance;Decreased balance;Decreased mobility;Decreased coordination;Decreased knowledge of use of DME;Decreased safety awareness;Pain ? ?   ?  ?PT Treatment Interventions DME instruction;Gait training;Functional mobility training;Stair training;Therapeutic activities;Therapeutic exercise;Balance training;Neuromuscular re-education;Patient/family education   ? ?PT Goals (Current goals can be found in the Care Plan section)  ?Acute Rehab PT Goals ?Patient Stated Goal: To walk without fatigue ?PT Goal Formulation: With patient ?Time For Goal Achievement: 05/28/21 ?Potential to Achieve Goals: Good ? ?  ?Frequency Min 2X/week ?  ? ? ?Co-evaluation   ?  ?  ?  ?  ? ? ?  ?AM-PAC  PT "6 Clicks" Mobility  ?Outcome Measure Help needed turning from your back to your side while in a flat bed without using bedrails?: A Little ?Help needed moving from lying on your back to sitting on the side of a flat bed without using bedrails?: A Little ?Help needed moving to and from a bed to a chair (including a wheelchair)?: A Little ?Help needed standing up from a chair using your arms  (e.g., wheelchair or bedside chair)?: A Little ?Help needed to walk in hospital room?: A Little ?Help needed climbing 3-5 steps with a railing? : A Lot ?6 Click Score: 17 ? ?  ?End of Session Equipment Utilized [place]

## 2021-05-14 NOTE — Progress Notes (Signed)
Orthopedic Tech Progress Note ?Patient Details:  ?Martin Foster ?1925-09-14 ?[id number] ? ?Patient ID: [name], male   DOB: May 01, 1925, 88 y.o.   MRN: [id number] ? ?Gerline Legacy KoontzCalled and Routed TLSO order to [employer] ?05/14/2021, 11:27 AM ? ?

## 2021-05-15 DIAGNOSIS — C7951 Secondary malignant neoplasm of bone: Secondary | ICD-10-CM | POA: Diagnosis present

## 2021-05-15 DIAGNOSIS — W1830XA Fall on same level, unspecified, initial encounter: Secondary | ICD-10-CM | POA: Diagnosis present

## 2021-05-15 DIAGNOSIS — Z85048 Personal history of other malignant neoplasm of rectum, rectosigmoid junction, and anus: Secondary | ICD-10-CM | POA: Diagnosis not present

## 2021-05-15 DIAGNOSIS — Z79899 Other long term (current) drug therapy: Secondary | ICD-10-CM | POA: Diagnosis not present

## 2021-05-15 DIAGNOSIS — Z974 Presence of external hearing-aid: Secondary | ICD-10-CM | POA: Diagnosis not present

## 2021-05-15 DIAGNOSIS — C61 Malignant neoplasm of prostate: Secondary | ICD-10-CM | POA: Diagnosis present

## 2021-05-15 DIAGNOSIS — Z7189 Other specified counseling: Secondary | ICD-10-CM | POA: Diagnosis not present

## 2021-05-15 DIAGNOSIS — Z800 Family history of malignant neoplasm of digestive organs: Secondary | ICD-10-CM | POA: Diagnosis not present

## 2021-05-15 DIAGNOSIS — M4804 Spinal stenosis, thoracic region: Secondary | ICD-10-CM | POA: Diagnosis present

## 2021-05-15 DIAGNOSIS — H9193 Unspecified hearing loss, bilateral: Secondary | ICD-10-CM | POA: Diagnosis present

## 2021-05-15 DIAGNOSIS — N390 Urinary tract infection, site not specified: Secondary | ICD-10-CM | POA: Diagnosis present

## 2021-05-15 DIAGNOSIS — Y92009 Unspecified place in unspecified non-institutional (private) residence as the place of occurrence of the external cause: Secondary | ICD-10-CM | POA: Diagnosis not present

## 2021-05-15 DIAGNOSIS — Y92239 Unspecified place in hospital as the place of occurrence of the external cause: Secondary | ICD-10-CM | POA: Diagnosis not present

## 2021-05-15 DIAGNOSIS — N3000 Acute cystitis without hematuria: Secondary | ICD-10-CM | POA: Diagnosis not present

## 2021-05-15 DIAGNOSIS — K5900 Constipation, unspecified: Secondary | ICD-10-CM | POA: Diagnosis present

## 2021-05-15 DIAGNOSIS — T380X5A Adverse effect of glucocorticoids and synthetic analogues, initial encounter: Secondary | ICD-10-CM | POA: Diagnosis not present

## 2021-05-15 DIAGNOSIS — D72829 Elevated white blood cell count, unspecified: Secondary | ICD-10-CM | POA: Diagnosis not present

## 2021-05-15 DIAGNOSIS — Z888 Allergy status to other drugs, medicaments and biological substances status: Secondary | ICD-10-CM | POA: Diagnosis not present

## 2021-05-15 DIAGNOSIS — Z87891 Personal history of nicotine dependence: Secondary | ICD-10-CM | POA: Diagnosis not present

## 2021-05-15 DIAGNOSIS — Z20822 Contact with and (suspected) exposure to covid-19: Secondary | ICD-10-CM | POA: Diagnosis present

## 2021-05-15 DIAGNOSIS — Z8042 Family history of malignant neoplasm of prostate: Secondary | ICD-10-CM | POA: Diagnosis not present

## 2021-05-15 DIAGNOSIS — Z515 Encounter for palliative care: Secondary | ICD-10-CM | POA: Diagnosis not present

## 2021-05-15 DIAGNOSIS — Z7952 Long term (current) use of systemic steroids: Secondary | ICD-10-CM | POA: Diagnosis not present

## 2021-05-15 DIAGNOSIS — M4854XA Collapsed vertebra, not elsewhere classified, thoracic region, initial encounter for fracture: Secondary | ICD-10-CM | POA: Diagnosis present

## 2021-05-15 DIAGNOSIS — I10 Essential (primary) hypertension: Secondary | ICD-10-CM | POA: Diagnosis present

## 2021-05-15 DIAGNOSIS — Z66 Do not resuscitate: Secondary | ICD-10-CM | POA: Diagnosis present

## 2021-05-15 DIAGNOSIS — R600 Localized edema: Secondary | ICD-10-CM | POA: Diagnosis present

## 2021-05-15 LAB — URINE CULTURE

## 2021-05-15 MED ORDER — CHLORHEXIDINE GLUCONATE CLOTH 2 % EX PADS
6.0000 | MEDICATED_PAD | Freq: Every day | CUTANEOUS | Status: DC
  Administered 2021-05-15 – 2021-05-18 (×4): 6 via TOPICAL

## 2021-05-15 MED ORDER — DEXAMETHASONE SODIUM PHOSPHATE 10 MG/ML IJ SOLN
10.0000 mg | Freq: Four times a day (QID) | INTRAMUSCULAR | Status: DC
  Administered 2021-05-15 – 2021-05-18 (×12): 10 mg via INTRAVENOUS
  Filled 2021-05-15 (×12): qty 1

## 2021-05-15 MED ORDER — SENNOSIDES-DOCUSATE SODIUM 8.6-50 MG PO TABS
1.0000 | ORAL_TABLET | Freq: Two times a day (BID) | ORAL | Status: DC
  Administered 2021-05-15 – 2021-05-17 (×4): 1 via ORAL
  Filled 2021-05-15 (×5): qty 1

## 2021-05-15 NOTE — Progress Notes (Signed)
?PROGRESS NOTE ? ? ? Martin Foster  OFB:[id number] DOB: 05-20-1925 DOA: 05/13/2021 ?PCP: [name], MD  ? ?Brief Narrative: ?Martin Foster is a 88 y.o. 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 Ander Slade  for radiation treatment, he completed 5 radiation treatments.  He presents today with inability to walk, due to pain and weakness in his legs and hip.  He presented initially confused in the ED due to severe pain.  After a dose of oral oxycodone his pain has improved, but he was not able to ambulate. ?  ?He was also found to have a UTI, he reported some dysuria and suprapubic pain.  UA with significant nitrates and 21-50 white blood cells. ?  ? ?Assessment & Plan: ?  ?Principal Problem: ?  UTI (urinary tract infection) ?Active Problems: ?  Prostate cancer metastatic to bone [facility]) ?  Hypertension ?  Weakness ? ?  ?  ?1-UTI: ?Patient presented with dysuria, UA with positive nitrates and 21-50 white blood cell. ?Follow urine culture. Multiples species .  ?Continue With IV ceftriaxone. Day 2.  ?Having urinary retention. If persist will need foley catheter.  ? ?2-Weakness, inability to ambulate: ?CT spine showed worsening compression fracture T 7 and new compression fracture T 8.  ?PT evaluation, recommending SNF>  ?B; 12: 361. ?  ?3-Metastatic prostate cancer to T7; New T 8 compression fracture.  ?He completed 5 treatments of radiation followed by Dr. Tammi Klippel ?Continue with oxycodone as needed. ?Continue with Flomax.  ?CT Thoracic: Osseous metastatic disease in the thoracic spine with increased, ? progressive height loss of T7, now 50%, previously 25%. There is encroachment of the T7 metastatic lesion upon the spinal canal and left neural foramen. Other osseous metastatic lesions are similar to recent PET-CT. New anterior compression fracture of T8, presumably pathologic, with ?up to 25% height loss. ?Discussed CT result with Dr Tammi Klippel 5/05; He recommend  MRI. Further recommendation depending on results.  ?MRI: Osseous metastases at T3, T7, T8, T11 and T12. Moderate spinal canal stenosis at T3-4 and T8 due to metastatic disease. Increased contrast enhancement along the dorsal aspect of the T12 ?vertebral body may indicate dilated venous plexus or epidural ?extension of metastatic disease. ?-Discussed MRI results with Dr. Sondra Come, he recommend IV decadron and patient might need further radiation. Dr Tammi Klippel to see on Monday.  ?-IV decadron.  ? ?4-Hypertension: PRN hydralazine order.  ?On Cozaar.  ? ?5-Left lower extremity edema: Doppler Negative for DVT ?6-Constipation. Miralax and senna  ? ?Estimated body mass index is 23.2 kg/m? as calculated from the following: ?  Height as of this encounter: '5\' 6"'$  (1.676 m). ?  Weight as of this encounter: 65.2 kg. ? ? ?DVT prophylaxis: Lovenox ?Code Status: DNR ?Family Communication: Crae discussed with daughter 5/05 ?Disposition Plan:  ?Status is: Observation ?The patient remains OBS appropriate and will d/c before 2 midnights. ? ? ? ?Consultants:  ?None ? ?Procedures:  ?None ? ?Antimicrobials:  ?Ceftriaxone ? ?Subjective: ?He report back pain is ok. He feels better.  ? ? ?Objective: ?Vitals:  ? 05/14/21 1010 05/14/21 1015 05/14/21 2154 05/15/21 0528  ?BP: (!) 184/82 (!) 186/91 (!) 96/54 (!) 127/58  ?Pulse:   77 69  ?Resp:   17 18  ?Temp:   98 ?F (36.7 ?C) 98.1 ?F (36.7 ?C)  ?TempSrc:   Oral Oral  ?SpO2: 99%  96% 97%  ?Weight:      ?Height:      ? ? ?  Intake/Output Summary (Last 24 hours) at 05/15/2021 0900 ?Last data filed at 05/15/2021 0555 ?Gross per 24 hour  ?Intake 220 ml  ?Output 830 ml  ?Net -610 ml  ? ? ?Filed Weights  ? 05/13/21 1939  ?Weight: 65.2 kg  ? ? ?Examination: ? ?General exam: NAD ?Respiratory system: CTA ?Cardiovascular system: S 1, S 2 RRR ?Gastrointestinal system: BS present, soft, nt, nd ?Central nervous system: Moves all 4 extremities.  ?Extremities: No edema ? ? ?Data Reviewed: I have personally reviewed  following labs and imaging studies ? ?CBC: ?Recent Labs  ?Lab 05/13/21 ?1226 05/14/21 ?0543  ?WBC 9.4 11.1*  ?NEUTROABS 8.0*  --   ?HGB 10.5* 10.4*  ?HCT 31.3* 32.6*  ?MCV 91.5 95.3  ?PLT 336 349  ? ? ?Basic Metabolic Panel: ?Recent Labs  ?Lab 05/13/21 ?1226 05/14/21 ?0543  ?NA 135 136  ?K 3.8 3.8  ?CL 102 103  ?CO2 25 24  ?GLUCOSE 131* 128*  ?BUN 18 22  ?CREATININE 0.69 0.87  ?CALCIUM 9.1 8.9  ? ? ?GFR: ?Estimated Creatinine Clearance: 46.9 mL/min (by C-G formula based on SCr of 0.87 mg/dL). ?Liver Function Tests: ?Recent Labs  ?Lab 05/14/21 ?0543  ?AST 20  ?ALT 16  ?ALKPHOS 73  ?BILITOT 0.7  ?PROT 6.9  ?ALBUMIN 2.8*  ? ? ?No results for input(s): LIPASE, AMYLASE in the last 168 hours. ?No results for input(s): AMMONIA in the last 168 hours. ?Coagulation Profile: ?No results for input(s): INR, PROTIME in the last 168 hours. ?Cardiac Enzymes: ?Recent Labs  ?Lab 05/13/21 ?1226  ?CKTOTAL 165  ? ? ?BNP (last 3 results) ?No results for input(s): PROBNP in the last 8760 hours. ?HbA1C: ?No results for input(s): HGBA1C in the last 72 hours. ?CBG: ?No results for input(s): GLUCAP in the last 168 hours. ?Lipid Profile: ?No results for input(s): CHOL, HDL, LDLCALC, TRIG, CHOLHDL, LDLDIRECT in the last 72 hours. ?Thyroid Function Tests: ?No results for input(s): TSH, T4TOTAL, FREET4, T3FREE, THYROIDAB in the last 72 hours. ?Anemia Panel: ?Recent Labs  ?  05/13/21 ?2009  ?VITAMINB12 361  ? ? ?Sepsis Labs: ?No results for input(s): PROCALCITON, LATICACIDVEN in the last 168 hours. ? ?Recent Results (from the past 240 hour(s))  ?Resp Panel by RT-PCR (Flu A&B, Covid) Nasopharyngeal Swab     Status: None  ? Collection Time: 05/13/21 12:13 PM  ? Specimen: Nasopharyngeal Swab; Nasopharyngeal(NP) swabs in vial transport medium  ?Result Value Ref Range Status  ? SARS Coronavirus 2 by RT PCR NEGATIVE NEGATIVE Final  ?  Comment: (NOTE) ?SARS-CoV-2 target nucleic acids are NOT DETECTED. ? ?The SARS-CoV-2 RNA is generally detectable in  upper respiratory ?specimens during the acute phase of infection. The lowest ?concentration of SARS-CoV-2 viral copies this assay can detect is ?138 copies/mL. A negative result does not preclude SARS-Cov-2 ?infection and should not be used as the sole basis for treatment or ?other patient management decisions. A negative result may occur with  ?improper specimen collection/handling, submission of specimen other ?than nasopharyngeal swab, presence of viral mutation(s) within the ?areas targeted by this assay, and inadequate number of viral ?copies(<138 copies/mL). A negative result must be combined with ?clinical observations, patient history, and epidemiological ?information. The expected result is Negative. ? ?Fact Sheet for Patients:  ?EntrepreneurPulse.com.au ? ?Fact Sheet for Healthcare Providers:  ?IncredibleEmployment.be ? ?This test is no t yet approved or cleared by the [place] FDA and  ?has been authorized for detection and/or diagnosis of SARS-CoV-2 by ?FDA under an Emergency Use Authorization (EUA). This  EUA will remain  ?in effect (meaning this test can be used) for the duration of the ?COVID-19 declaration under Section 564(b)(1) of the Act, 21 ?U.S.C.section 360bbb-3(b)(1), unless the authorization is terminated  ?or revoked sooner.  ? ? ?  ? Influenza A by PCR NEGATIVE NEGATIVE Final  ? Influenza B by PCR NEGATIVE NEGATIVE Final  ?  Comment: (NOTE) ?The Xpert Xpress SARS-CoV-2/FLU/RSV plus assay is intended as an aid ?in the diagnosis of influenza from Nasopharyngeal swab specimens and ?should not be used as a sole basis for treatment. Nasal washings and ?aspirates are unacceptable for Xpert Xpress SARS-CoV-2/FLU/RSV ?testing. ? ?Fact Sheet for Patients: ?EntrepreneurPulse.com.au ? ?Fact Sheet for Healthcare Providers: ?IncredibleEmployment.be ? ?This test is not yet approved or cleared by the [place] FDA and ?has been  authorized for detection and/or diagnosis of SARS-CoV-2 by ?FDA under an Emergency Use Authorization (EUA). This EUA will remain ?in effect (meaning this test can be used) for the duration of the ?COVID-19 declar

## 2021-05-16 DIAGNOSIS — N3000 Acute cystitis without hematuria: Secondary | ICD-10-CM | POA: Diagnosis not present

## 2021-05-16 LAB — BASIC METABOLIC PANEL
Anion gap: 10 (ref 5–15)
BUN: 37 mg/dL — ABNORMAL HIGH (ref 8–23)
CO2: 25 mmol/L (ref 22–32)
Calcium: 9.3 mg/dL (ref 8.9–10.3)
Chloride: 100 mmol/L (ref 98–111)
Creatinine, Ser: 0.93 mg/dL (ref 0.61–1.24)
GFR, Estimated: >60 mL/min (ref >60)
Glucose, Bld: 198 mg/dL — ABNORMAL HIGH (ref 70–99)
Potassium: 4.3 mmol/L (ref 3.5–5.1)
Sodium: 135 mmol/L (ref 135–145)

## 2021-05-16 LAB — CBC
HCT: 31.4 % — ABNORMAL LOW (ref 39.0–52.0)
Hemoglobin: 10.4 g/dL — ABNORMAL LOW (ref 13.0–17.0)
MCH: 30.6 pg (ref 26.0–34.0)
MCHC: 33.1 g/dL (ref 30.0–36.0)
MCV: 92.4 fL (ref 80.0–100.0)
Platelets: 381 10*3/uL (ref 150–400)
RBC: 3.4 MIL/uL — ABNORMAL LOW (ref 4.22–5.81)
RDW: 14.6 % (ref 11.5–15.5)
WBC: 12.7 10*3/uL — ABNORMAL HIGH (ref 4.0–10.5)
nRBC: 0 % (ref 0.0–0.2)

## 2021-05-16 NOTE — Progress Notes (Signed)
?PROGRESS NOTE ? ? ? Xaviar Lunn  AVW:[id number] DOB: Oct 18, 1925 DOA: 05/13/2021 ?PCP: [name], MD  ? ?Brief Narrative: ?Mackson Kashius Dominic is a 88 y.o. 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 Ander Slade  for radiation treatment, he completed 5 radiation treatments.  He presents today with inability to walk, due to pain and weakness in his legs and hip.  He presented initially confused in the ED due to severe pain.  After a dose of oral oxycodone his pain has improved, but he was not able to ambulate. ?  ?He was also found to have a UTI, he reported some dysuria and suprapubic pain.  UA with significant nitrates and 21-50 white blood cells. ?  ?Found to have multiples ne  ? ?Assessment & Plan: ?  ?Principal Problem: ?  UTI (urinary tract infection) ?Active Problems: ?  Prostate cancer metastatic to bone [facility]) ?  Hypertension ?  Weakness ? ?  ?  ?1-UTI: ?Patient presented with dysuria, UA with positive nitrates and 21-50 white blood cell. ?Follow urine culture. Multiples species.  ?Continue With IV ceftriaxone. Day 3  ?He was having issues with urine retention. Foley catheter placed 5/06. ? ?2-Weakness, inability to ambulate: ?CT spine showed worsening compression fracture T 7 and new compression fracture T 8.  ?PT evaluation, recommending SNF>  ?B; 12: 361. ?  ?3-Metastatic prostate cancer to T7; New T 8 compression fracture.  ?He completed 5 treatments of radiation followed by Dr. Tammi Klippel ?Continue with oxycodone as needed. ?Continue with Flomax.  ?CT Thoracic: Osseous metastatic disease in the thoracic spine with increased, ? progressive height loss of T7, now 50%, previously 25%. There is encroachment of the T7 metastatic lesion upon the spinal canal and left neural foramen. Other osseous metastatic lesions are similar to recent PET-CT. New anterior compression fracture of T8, presumably pathologic, with ?up to 25% height loss. ?Discussed CT result with  Dr Tammi Klippel 5/05; He recommend MRI. Further recommendation depending on results.  ?MRI: Osseous metastases at T3, T7, T8, T11 and T12. Moderate spinal canal stenosis at T3-4 and T8 due to metastatic disease. Increased contrast enhancement along the dorsal aspect of the T12 ?vertebral body may indicate dilated venous plexus or epidural ?extension of metastatic disease. ?-Discussed MRI results with Dr. Sondra Come 5/06, he recommend IV decadron and patient might need further radiation. Dr Tammi Klippel to see on Monday.  ?-IV decadron.  ?Pain better.  ? ?4-Hypertension: PRN hydralazine order.  ?On Cozaar.  ? ?5-Left lower extremity edema: Doppler Negative for DVT ?6-Constipation. Miralax and senna.  ?7-Leukocytosis; likely related to IV steroids.  ? ?Estimated body mass index is 23.2 kg/m? as calculated from the following: ?  Height as of this encounter: '5\' 6"'$  (1.676 m). ?  Weight as of this encounter: 65.2 kg. ? ? ?DVT prophylaxis: Lovenox ?Code Status: DNR ?Family Communication: Crae discussed with daughter 5/07 ?Disposition Plan:  ?Status is: Observation ?The patient remains OBS appropriate and will d/c before 2 midnights. ? ? ? ?Consultants:  ?None ? ?Procedures:  ?None ? ?Antimicrobials:  ?Ceftriaxone ? ?Subjective: ?He report back pain is better controlled. He denies dyspnea.  ?Foley catheter placed.  ? ?Objective: ?Vitals:  ? 05/14/21 2154 05/15/21 0528 05/15/21 1501 05/16/21 0451  ?BP: (!) 96/54 (!) 127/58 (!) 153/65 135/71  ?Pulse: 77 69 82 66  ?Resp: '17 18 18 18  '$ ?Temp: 98 ?F (36.7 ?C) 98.1 ?F (36.7 ?C) 98.5 ?F (36.9 ?C) (!) 97.5 ?F (  36.4 ?C)  ?TempSrc: Oral Oral Oral Oral  ?SpO2: 96% 97% 94% 94%  ?Weight:      ?Height:      ? ? ?Intake/Output Summary (Last 24 hours) at 05/16/2021 1423 ?Last data filed at 05/16/2021 1055 ?Gross per 24 hour  ?Intake 759.3 ml  ?Output 2550 ml  ?Net -1790.7 ml  ? ? ?Filed Weights  ? 05/13/21 1939  ?Weight: 65.2 kg  ? ? ?Examination: ? ?General exam: NAD ?Respiratory system: CTA ?Cardiovascular  system: S 1, S 2 RRR ?Gastrointestinal system: BS present, soft, nt ?Central nervous system: Moves all 4 extremities.  ?Extremities: edema right LE ? ? ?Data Reviewed: I have personally reviewed following labs and imaging studies ? ?CBC: ?Recent Labs  ?Lab 05/13/21 ?1226 05/14/21 ?4970 05/16/21 ?2637  ?WBC 9.4 11.1* 12.7*  ?NEUTROABS 8.0*  --   --   ?HGB 10.5* 10.4* 10.4*  ?HCT 31.3* 32.6* 31.4*  ?MCV 91.5 95.3 92.4  ?PLT 336 349 381  ? ? ?Basic Metabolic Panel: ?Recent Labs  ?Lab 05/13/21 ?1226 05/14/21 ?8588 05/16/21 ?5027  ?NA 135 136 135  ?K 3.8 3.8 4.3  ?CL 102 103 100  ?CO2 '25 24 25  '$ ?GLUCOSE 131* 128* 198*  ?BUN 18 22 37*  ?CREATININE 0.69 0.87 0.93  ?CALCIUM 9.1 8.9 9.3  ? ? ?GFR: ?Estimated Creatinine Clearance: 43.8 mL/min (by C-G formula based on SCr of 0.93 mg/dL). ?Liver Function Tests: ?Recent Labs  ?Lab 05/14/21 ?0543  ?AST 20  ?ALT 16  ?ALKPHOS 73  ?BILITOT 0.7  ?PROT 6.9  ?ALBUMIN 2.8*  ? ? ?No results for input(s): LIPASE, AMYLASE in the last 168 hours. ?No results for input(s): AMMONIA in the last 168 hours. ?Coagulation Profile: ?No results for input(s): INR, PROTIME in the last 168 hours. ?Cardiac Enzymes: ?Recent Labs  ?Lab 05/13/21 ?1226  ?CKTOTAL 165  ? ? ?BNP (last 3 results) ?No results for input(s): PROBNP in the last 8760 hours. ?HbA1C: ?No results for input(s): HGBA1C in the last 72 hours. ?CBG: ?No results for input(s): GLUCAP in the last 168 hours. ?Lipid Profile: ?No results for input(s): CHOL, HDL, LDLCALC, TRIG, CHOLHDL, LDLDIRECT in the last 72 hours. ?Thyroid Function Tests: ?No results for input(s): TSH, T4TOTAL, FREET4, T3FREE, THYROIDAB in the last 72 hours. ?Anemia Panel: ?Recent Labs  ?  05/13/21 ?2009  ?VITAMINB12 361  ? ? ?Sepsis Labs: ?No results for input(s): PROCALCITON, LATICACIDVEN in the last 168 hours. ? ?Recent Results (from the past 240 hour(s))  ?Urine Culture     Status: Abnormal  ? Collection Time: 05/13/21 12:12 PM  ? Specimen: Urine, Clean Catch  ?Result Value  Ref Range Status  ? Specimen Description   Final  ?  URINE, CLEAN CATCH ?Performed at [facility], [place] [address] [place], [place] [address] ?  ? Special Requests   Final  ?  NONE ?Performed at [facility], [place] [address] [place],  [address] ?  ? Culture MULTIPLE SPECIES PRESENT, SUGGEST RECOLLECTION (A)  Final  ? Report Status 05/15/2021 FINAL  Final  ?Resp Panel by RT-PCR (Flu A&B, Covid) Nasopharyngeal Swab     Status: None  ? Collection Time: 05/13/21 12:13 PM  ? Specimen: Nasopharyngeal Swab; Nasopharyngeal(NP) swabs in vial transport medium  ?Result Value Ref Range Status  ? SARS Coronavirus 2 by RT PCR NEGATIVE NEGATIVE Final  ?  Comment: (NOTE) ?SARS-CoV-2 target nucleic acids are NOT DETECTED. ? ?The SARS-CoV-2 RNA is generally detectable in upper respiratory ?specimens  during the acute phase of infection. The lowest ?concentration of SARS-CoV-2 viral copies this assay can detect is ?138 copies/mL. A negative result does not preclude SARS-Cov-2 ?infection and should not be used as the sole basis for treatment or ?other patient management decisions. A negative result may occur with  ?improper specimen collection/handling, submission of specimen other ?than nasopharyngeal swab, presence of viral mutation(s) within the ?areas targeted by this assay, and inadequate number of viral ?copies(<138 copies/mL). A negative result must be combined with ?clinical observations, patient history, and epidemiological ?information. The expected result is Negative. ? ?Fact Sheet for Patients:  ?EntrepreneurPulse.com.au ? ?Fact Sheet for Healthcare Providers:  ?IncredibleEmployment.be ? ?This test is no t yet approved or cleared by the [place] FDA and  ?has been authorized for detection and/or diagnosis of SARS-CoV-2 by ?FDA under an Emergency Use Authorization (EUA). This EUA will remain  ?in effect (meaning this test can be used) for  the duration of the ?COVID-19 declaration under Section 564(b)(1) of the Act, 21 ?U.S.C.section 360bbb-3(b)(1), unless the authorization is terminated  ?or revoked sooner.  ? ? ?  ? Influenza A by [facility]

## 2021-05-17 ENCOUNTER — Inpatient Hospital Stay: Payer: Medicare Other | Attending: Radiation Oncology

## 2021-05-17 DIAGNOSIS — Z515 Encounter for palliative care: Secondary | ICD-10-CM

## 2021-05-17 DIAGNOSIS — C7951 Secondary malignant neoplasm of bone: Secondary | ICD-10-CM

## 2021-05-17 DIAGNOSIS — Z7189 Other specified counseling: Secondary | ICD-10-CM

## 2021-05-17 DIAGNOSIS — C61 Malignant neoplasm of prostate: Secondary | ICD-10-CM

## 2021-05-17 MED ORDER — BOOST / RESOURCE BREEZE PO LIQD CUSTOM
1.0000 | ORAL | Status: DC
  Administered 2021-05-17: 1 via ORAL

## 2021-05-17 NOTE — Progress Notes (Signed)
?PROGRESS NOTE ? ? ? [name]  YJE:[id number] DOB: 1925-03-11 DOA: 05/13/2021 ?PCP: [name], MD  ? ?Brief Narrative: ?Martin Foster is a 88 y.o. 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 Ander Slade  for radiation treatment, he completed 5 radiation treatments.  He presents today with inability to walk, due to pain and weakness in his legs and hip.  He presented initially confused in the ED due to severe pain.  After a dose of oral oxycodone his pain has improved, but he was not able to ambulate. ?  ?He was also found to have a UTI, he reported some dysuria and suprapubic pain.  UA with significant nitrates and 21-50 white blood cells. ?  ?Found to have multiples new metastatic lesion Spine.  ? ?Assessment & Plan: ?  ?Principal Problem: ?  UTI (urinary tract infection) ?Active Problems: ?  Prostate cancer metastatic to bone [facility]) ?  Hypertension ?  Weakness ? ?  ?  ?1-UTI: ?Patient presented with dysuria, UA with positive nitrates and 21-50 white blood cell. ?Follow urine culture. Multiples species.  ?Continue With IV ceftriaxone. Day 4 ?He was having issues with urine retention. Foley catheter placed 5/06. ?He will probably need 7 days course of antibiotics. He can be discharge on oral antibiotics.  ? ?2-Weakness, inability to ambulate: ?CT spine showed worsening compression fracture T 7 and new compression fracture T 8.  ?PT evaluation, recommending SNF>  ?B; 12: 361. ?  ?3-Metastatic prostate cancer to T7; New T 8 compression fracture.  ?He completed 5 treatments of radiation followed by Dr. Tammi Klippel ?Continue with oxycodone as needed. ?Continue with Flomax.  ?CT Thoracic: Osseous metastatic disease in the thoracic spine with increased, ? progressive height loss of T7, now 50%, previously 25%. There is encroachment of the T7 metastatic lesion upon the spinal canal and left neural foramen. Other osseous metastatic lesions are similar to recent  PET-CT. New anterior compression fracture of T8, presumably pathologic, with ?up to 25% height loss. ?Discussed CT result with Dr Tammi Klippel 5/05; He recommend MRI. Further recommendation depending on results.  ?MRI: Osseous metastases at T3, T7, T8, T11 and T12. Moderate spinal canal stenosis at T3-4 and T8 due to metastatic disease. Increased contrast enhancement along the dorsal aspect of the T12 ?vertebral body may indicate dilated venous plexus or epidural ?extension of metastatic disease. ?-Discussed MRI results with Dr. Sondra Come 5/06, he recommend IV decadron and patient might need further radiation.  ?-IV decadron.  ?Pain better.  ?Appreciate Dr Tammi Klippel, no plan for radiation treatment. Palliative care consultation  ? ?4-Hypertension: PRN hydralazine order.  ?On Cozaar.  ? ?5-Left lower extremity edema: Doppler Negative for DVT ?6-Constipation. Miralax and senna. Having BM  ?7-Leukocytosis; likely related to IV steroids.  ? ?Estimated body mass index is 23.2 kg/m? as calculated from the following: ?  Height as of this encounter: '5\' 6"'$  (1.676 m). ?  Weight as of this encounter: 65.2 kg. ? ? ?DVT prophylaxis: Lovenox ?Code Status: DNR ?Family Communication: Crae discussed with daughter 5/07 ?Disposition Plan:  ?Status is: Observation ?The patient will require care spanning > 2 midnights and should be moved to inpatient because: Awaiting SNF  ? ? ? ?Consultants:  ?None ? ?Procedures:  ?None ? ?Antimicrobials:  ?Ceftriaxone ? ?Subjective: ?He report back pain is controlled. Having BM  ?Appreciate his care here in the hospital.  ? ?Objective: ?Vitals:  ? 05/16/21 0451 05/16/21 1420 05/16/21 2100 05/17/21 0447  ?  BP: 135/71 119/64 (!) 153/74 (!) 152/79  ?Pulse: 66 72 80 86  ?Resp: '18 16 18 18  '$ ?Temp: (!) 97.5 ?F (36.4 ?C) 98.2 ?F (36.8 ?C) 97.9 ?F (36.6 ?C) 97.6 ?F (36.4 ?C)  ?TempSrc: Oral Oral Oral Oral  ?SpO2: 94% 95% 98% 98%  ?Weight:      ?Height:      ? ? ?Intake/Output Summary (Last 24 hours) at 05/17/2021  1501 ?Last data filed at 05/17/2021 0500 ?Gross per 24 hour  ?Intake 540.69 ml  ?Output 1525 ml  ?Net -984.31 ml  ? ? ?Filed Weights  ? 05/13/21 1939  ?Weight: 65.2 kg  ? ? ?Examination: ? ?General exam: NAD ?Respiratory system: CTA ?Cardiovascular system: S 1, S 2 RRR ?Gastrointestinal system: BS present, soft, nt ?Central nervous system: Moves all 4 extremities.  ?Extremities: trace edema ? ? ?Data Reviewed: I have personally reviewed following labs and imaging studies ? ?CBC: ?Recent Labs  ?Lab 05/13/21 ?1226 05/14/21 ?5053 05/16/21 ?9767  ?WBC 9.4 11.1* 12.7*  ?NEUTROABS 8.0*  --   --   ?HGB 10.5* 10.4* 10.4*  ?HCT 31.3* 32.6* 31.4*  ?MCV 91.5 95.3 92.4  ?PLT 336 349 381  ? ? ?Basic Metabolic Panel: ?Recent Labs  ?Lab 05/13/21 ?1226 05/14/21 ?3419 05/16/21 ?3790  ?NA 135 136 135  ?K 3.8 3.8 4.3  ?CL 102 103 100  ?CO2 '25 24 25  '$ ?GLUCOSE 131* 128* 198*  ?BUN 18 22 37*  ?CREATININE 0.69 0.87 0.93  ?CALCIUM 9.1 8.9 9.3  ? ? ?GFR: ?Estimated Creatinine Clearance: 43.8 mL/min (by C-G formula based on SCr of 0.93 mg/dL). ?Liver Function Tests: ?Recent Labs  ?Lab 05/14/21 ?0543  ?AST 20  ?ALT 16  ?ALKPHOS 73  ?BILITOT 0.7  ?PROT 6.9  ?ALBUMIN 2.8*  ? ? ?No results for input(s): LIPASE, AMYLASE in the last 168 hours. ?No results for input(s): AMMONIA in the last 168 hours. ?Coagulation Profile: ?No results for input(s): INR, PROTIME in the last 168 hours. ?Cardiac Enzymes: ?Recent Labs  ?Lab 05/13/21 ?1226  ?CKTOTAL 165  ? ? ?BNP (last 3 results) ?No results for input(s): PROBNP in the last 8760 hours. ?HbA1C: ?No results for input(s): HGBA1C in the last 72 hours. ?CBG: ?No results for input(s): GLUCAP in the last 168 hours. ?Lipid Profile: ?No results for input(s): CHOL, HDL, LDLCALC, TRIG, CHOLHDL, LDLDIRECT in the last 72 hours. ?Thyroid Function Tests: ?No results for input(s): TSH, T4TOTAL, FREET4, T3FREE, THYROIDAB in the last 72 hours. ?Anemia Panel: ?No results for input(s): VITAMINB12, FOLATE, FERRITIN, TIBC,  IRON, RETICCTPCT in the last 72 hours. ? ?Sepsis Labs: ?No results for input(s): PROCALCITON, LATICACIDVEN in the last 168 hours. ? ?Recent Results (from the past 240 hour(s))  ?Urine Culture     Status: Abnormal  ? Collection Time: 05/13/21 12:12 PM  ? Specimen: Urine, Clean Catch  ?Result Value Ref Range Status  ? Specimen Description   Final  ?  URINE, CLEAN CATCH ?Performed at [facility], [place] [address] [place], [place] [address] ?  ? Special Requests   Final  ?  NONE ?Performed at [facility], [place] [address], [place], [place] [address] ?  ? Culture MULTIPLE SPECIES PRESENT, SUGGEST RECOLLECTION (A)  Final  ? Report Status 05/15/2021 FINAL  Final  ?Resp Panel by RT-PCR (Flu A&B, Covid) Nasopharyngeal Swab     Status: None  ? Collection Time: 05/13/21 12:13 PM  ? Specimen: Nasopharyngeal Swab; Nasopharyngeal(NP) swabs in vial transport medium  ?Result  Value Ref Range Status  ? SARS Coronavirus 2 by RT PCR NEGATIVE NEGATIVE Final  ?  Comment: (NOTE) ?SARS-CoV-2 target nucleic acids are NOT DETECTED. ? ?The SARS-CoV-2 RNA is generally detectable in upper respiratory ?specimens during the acute phase of infection. The lowest ?concentration of SARS-CoV-2 viral copies this assay can detect is ?138 copies/mL. A negative result does not preclude SARS-Cov-2 ?infection and should not be used as the sole basis for treatment or ?other patient management decisions. A negative result may occur with  ?improper specimen collection/handling, submission of specimen other ?than nasopharyngeal swab, presence of viral mutation(s) within the ?areas targeted by this assay, and inadequate number of viral ?copies(<138 copies/mL). A negative result must be combined with ?clinical observations, patient history, and epidemiological ?information. The expected result is Negative. ? ?Fact Sheet for Patients:  ?EntrepreneurPulse.com.au ? ?Fact Sheet for Healthcare Providers:   ?IncredibleEmployment.be ? ?This test is no t yet approved or cleared by the [place] FDA and  ?has been authorized for detection and/or diagnosis of SARS-CoV-2 by ?FDA under an Emergency Use Authorizatio

## 2021-05-17 NOTE — Consult Note (Signed)
? ?                                                                                ?Consultation Note ?Date: 05/17/2021  ? ?Patient Name: Martin Foster  ?DOB: 14-Mar-1925  MRN: [id number]  Age / Sex: 88 y.o., male  ?PCP: [name], MD ?Referring Physician: Elmarie Shiley, MD ? ?Reason for Consultation: Establishing goals of care ? ?HPI/Patient Profile: 88 y.o. male  with past medical history of prostate cancer with mets to spine s/p radiation treatment to T7, h/o rectal cancer, renal disorder chronic indwelling catheter, hypertension, hard of hearing admitted on 05/13/2021 with weakness and inability to walk due to pain and weakness in legs and hips along with noted dysuria and suprapubic pain with UTI. MRI spine with mets at T3, T8, T9, T12 but no cord compression. No recommendations for further radiation therapy.  ? ?Clinical Assessment and Goals of Care: ?I met today with Martin Foster after reviewing records. Noted no role for further radiation therapy and consideration of hospice given progressing cancer with complications from bone mets. Martin Foster is sitting up in recliner and eating lunch (good intake and has eaten most of his meal). He has on brace and able to move his legs and tells me that his pain and weakness are much improved. He is very hard of hearing but has working hearing aide in left ear enabling to hear better. He shares many stories and life review talking about the death of his first wife and daughter. He shares about his travels with building missions through his [employer]. He shares his passion for helping people. 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  ? ?We talk more about his health and concern that his cancer is worsening despite treatment. We discussed the risks vs benefits of continued treatment. He is very pleased with his progress with improved pain and movement. I explained  that this is why the doctors have mentioned having help with hospice because they are best at helping people at home to manage their pain and symptoms to have improved time with their loved ones. He has better understanding. He seems open to the recommendations of his medical team. He is tearful talking about end of life but he has done some planning and discussions with his wife and he tells me that he has strong faith so he is no worried about where he is going and this gives him comfort. His main goal is to be able to enjoy whatever time is left with his family and to do as well as he can for as long as he can. He asks that I call and speak with his wife [name].  ? ?I will reach out to wife [name] tomorrow at Martin Foster's request.  ? ?Primary Decision Maker ?PATIENT ?  ? ?SUMMARY OF RECOMMENDATIONS   ?- Open to recommendations from his medical team ?- Hopeful for some level of improvement ? ?Code Status/Advance Care Planning: ?DNR ? ? ?Symptom Management:  ?Per attending, oncology, urology.  ? ?Palliative Prophylaxis:  ?Bowel  Regimen, Frequent Pain Assessment, and Turn Reposition ? ?Prognosis:  ?Overall prognosis poor with advancing cancer further impacting functional status.  ? ?Discharge Planning: SNF rehab vs home with hospice.  ? ?  ? ?Primary Diagnoses: ?Present on Admission: ? UTI (urinary tract infection) ? ? ?I have reviewed the medical record, interviewed the patient and family, and examined the patient. The following aspects are pertinent. ? ?Past Medical History:  ?Diagnosis Date  ? Hypertension   ? Prostate cancer ([place])   ? Rectal cancer ([place]) 01/09/2020  ? Renal disorder   ? catheter since 06/26/11  ? ?Social History  ? ?Socioeconomic History  ? Marital status: Married  ?  Spouse name: Not on file  ? Number of children: Not on file  ? Years of education: Not on file  ? Highest education level: Not on file  ?Occupational History  ? Not on file  ?Tobacco Use  ? Smoking status: Former  ? Smokeless  tobacco: Never  ?Substance and Sexual Activity  ? Alcohol use: No  ? Drug use: No  ? Sexual activity: Not on file  ?Other Topics Concern  ? Not on file  ?Social History Narrative  ? Not on file  ? ?Social Determinants of Health  ? ?Financial Resource Strain: Not on file  ?Food Insecurity: Not on file  ?Transportation Needs: Not on file  ?Physical Activity: Not on file  ?Stress: Not on file  ?Social Connections: Not on file  ? ?Family History  ?Problem Relation Age of Onset  ? Gastric cancer Mother   ? Prostate cancer Father   ? Colon cancer Father   ? ?Scheduled Meds: ? Chlorhexidine Gluconate Cloth  6 each Topical Daily  ? dexamethasone (DECADRON) injection  10 mg Intravenous Q6H  ? enoxaparin (LOVENOX) injection  40 mg Subcutaneous Q24H  ? feeding supplement  237 mL Oral BID BM  ? losartan  100 mg Oral Daily  ? oxyCODONE  5-10 mg Oral Q6H  ? polyethylene glycol  17 g Oral Daily  ? senna-docusate  1 tablet Oral BID  ? sodium chloride flush  3 mL Intravenous Q12H  ? tamsulosin  0.4 mg Oral Daily  ? vitamin B-12  1,000 mcg Oral Daily  ? ?Continuous Infusions: ? sodium chloride    ? cefTRIAXone (ROCEPHIN)  IV Stopped (05/16/21 1455)  ? ?PRN Meds:.sodium chloride, acetaminophen **OR** acetaminophen, hydrALAZINE, morphine injection, ondansetron **OR** ondansetron (ZOFRAN) IV, sodium chloride flush ?Allergies  ?Allergen Reactions  ? Lisinopril Swelling  ?  Facial swelling  ? Olmesartan Medoxomil-Hctz Swelling  ?  Facial swelling  ? Ampicillin Swelling  ?  Facial swelling  ? Aspartame And Phenylalanine Swelling  ?  Facial swelling  ? Ciprofibrate Swelling  ?  Facial swelling  ? Ciprofloxacin Swelling  ?  Facial and leg swelling  ? Erythromycin Swelling  ?  Facial swelling  ? Fd&C Yellow #6 [Yellow Dye] Other (See Comments)  ?  Unknown reaction  ? ?Review of Systems  ?Constitutional:  Positive for activity change. Negative for appetite change.  ?Respiratory:  Negative for shortness of breath.   ?Neurological:  Positive for  weakness.  ? ?Physical Exam ?Vitals and nursing note reviewed.  ?Constitutional:   ?   General: He is not in acute distress. ?   Appearance: He is ill-appearing.  ?Cardiovascular:  ?   Rate and Rhythm: Normal rate.  ?Pulmonary:  ?   Effort: No tachypnea, accessory muscle usage or respiratory distress.  ?Abdominal:  ?   Palpations:  Abdomen is soft.  ?Neurological:  ?   Mental Status: He is alert and oriented to person, place, and time.  ?   Comments: Very hard of hearing  ? ? ?Vital Signs: BP (!) 152/79 (BP Location: Left Arm)   Pulse 86   Temp 97.6 ?F (36.4 ?C) (Oral)   Resp 18   Ht 5' 6"  (1.676 m)   Wt 65.2 kg   SpO2 98%   BMI 23.20 kg/m?  ?Pain Scale: 0-10 ?POSS *See Group Information*: 1-Acceptable,Awake and alert ?Pain Score: 0-No pain ? ? ?SpO2: SpO2: 98 % ?O2 Device:SpO2: 98 % ?O2 Flow Rate: .  ? ?IO: Intake/output summary:  ?Intake/Output Summary (Last 24 hours) at 05/17/2021 1039 ?Last data filed at 05/17/2021 0500 ?Gross per 24 hour  ?Intake 1020.69 ml  ?Output 1975 ml  ?Net -954.31 ml  ? ? ?LBM: Last BM Date : 05/16/21 ?Baseline Weight: Weight: 65.2 kg ?Most recent weight: Weight: 65.2 kg     ?Palliative Assessment/Data: ? ? ? ? ?Time Total: 75 min ? ?Greater than 50%  of this time was spent counseling and coordinating care related to the above assessment and plan. ? ?Signed by: ?Vinie Sill, NP ?Palliative Medicine Team ?Pager # [phone] (M-F 8a-5p) ?Team Phone # [phone] (Nights/Weekends) ?  ? ? ? ? ? ? ? ? ? ? ? ? ?  ?

## 2021-05-17 NOTE — Progress Notes (Signed)
Initial Nutrition Assessment ? ?DOCUMENTATION CODES:  ? ?Not applicable ? ?INTERVENTION:  ?- continue Ensure Plus High Protein BID, each supplement provides 350 kcal and 20 grams of protein. ? ?- will order Boost Breeze once/day each supplement provides 250 kcal and 9 grams of protein. ? ?- complete NFPE when feasible.  ? ? ?NUTRITION DIAGNOSIS:  ? ?Increased nutrient needs related to acute illness, cancer and cancer related treatments, chronic illness as evidenced by estimated needs. ? ?GOAL:  ? ?Patient will meet greater than or equal to 90% of their needs ? ?MONITOR:  ? ?PO intake, Supplement acceptance, Labs, Weight trends ? ?REASON FOR ASSESSMENT:  ? ?Malnutrition Screening Tool ? ?ASSESSMENT:  ? ?88 y.o. 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 He presented to the ED due to inability to walk d/t pain and weakness in legs and hips. In the ED he was noted to have a UTI. Since admission, he has been found to have multiple new metastatic lesions to the spine. ? ?Unable to see patient at the time of attempted visit. Review of flow sheets indicates patient ate 100% at all meals from dinner on 5/5 through dinner on 5/7; no documentation from breakfast or lunch today.  ? ?Review of order indicates that he has been accepting Ensure 90% of the time offered. ? ?Weight on 5/4 was 144 lb and weight on 3/25 was 154 lb. This indicates 10 lb weight loss (6% body weight) in the past 1.5 months.  ? ?Non-pitting edema to RLE and mild pitting edema to LLE documented in the edema section of flow sheet.  ? ? ?Labs reviewed; BUN: 37 mg/dl. ?Medications reviewed; 17 g miralax/day, 1 tablet senokot BID, 1000 mcg oral cyanocobalamin/day.  ?  ? ?NUTRITION - FOCUSED PHYSICAL EXAM: ? ?Unable to complete at this time.  ? ?Diet Order:   ?Diet Order   ? ?       ?  Diet regular Room service appropriate? Yes; Fluid consistency: Thin  Diet  effective now       ?  ? ?  ?  ? ?  ? ? ?EDUCATION NEEDS:  ? ?No education needs have been identified at this time ? ?Skin:  Skin Assessment: Reviewed RN Assessment ? ?Last BM:  5/7 (Type 6 x2) ? ?Height:  ? ?Ht Readings from Last 1 Encounters:  ?05/13/21 '5\' 6"'$  (1.676 m)  ? ? ?Weight:  ? ?Wt Readings from Last 1 Encounters:  ?05/13/21 65.2 kg  ? ? ? ?BMI:  Body mass index is 23.2 kg/m?. ? ?Estimated Nutritional Needs:  ?Kcal:  1750-1950 kcal ?Protein:  80-95 grams ?Fluid:  >/= 1.8 L/day ? ? ? ? ?Jarome Matin, MS, RD, LDN ?Registered Dietitian II ?Inpatient Clinical Nutrition ?RD pager # and on-call/weekend pager # available in [place]  ? ?

## 2021-05-17 NOTE — Progress Notes (Signed)
Physical Therapy Treatment ?Patient Details ?Name: Martin Foster ?MRN: [id number] ?DOB: 1925/11/04 ?Today's Date: 05/17/2021 ? ? ?History of Present Illness Pt is a 88yo male presenting to [facility] ED on 05/13/21 with inability to walk with  pain and weakness in BLEs and hip, fell at home in bathroom and called 911 to come to [facility]. CT 5/4 found metastatic disease in T7 with L foraminal compression and new anterior compression fracture of T8. Found to have UTI. Of note: currently undergoing radiation.  PMH: HTN, hx of prostate and rectal cancer. ? ?  ?PT Comments  ? ? Pt progressing toward PT goals. Incr tolerance to activity, incr amb distance without incr pain this session. Pt required encouragement to progress distance however tol well.    ?Recommendations for follow up therapy are one component of a multi-disciplinary discharge planning process, led by the attending physician.  Recommendations may be updated based on patient status, additional functional criteria and insurance authorization. ? ?Follow Up Recommendations ? Skilled nursing-short term rehab (<3 hours/day) ?  ?  ?Assistance Recommended at Discharge Frequent or constant Supervision/Assistance  ?Patient can return home with the following Help with stairs or ramp for entrance;Assist for transportation;Assistance with cooking/housework;A little help with bathing/dressing/bathroom;A little help with walking and/or transfers ?  ?Equipment Recommendations ? None recommended by PT  ?  ?Recommendations for Other Services   ? ? ?  ?Precautions / Restrictions Precautions ?Precautions: Fall ?Precaution Comments: Recent fall that lead to this hospital admission. pt ahs TLSO (had apparently prior to admission) no orders this admission as to parameters of brace. donned TLSO in sitting and reviewed doffing with pt as well. ?Required Braces or Orthoses: Spinal Brace ?Spinal Brace: Thoracolumbosacral orthotic ?Restrictions ?Weight Bearing Restrictions: No  ?  ? ?Mobility ? Bed  Mobility ?Overal bed mobility: Needs Assistance ?Bed Mobility: Rolling, Sidelying to Sit ?Rolling: Min guard ?Sidelying to sit: Min guard, HOB elevated ?  ?  ?  ?General bed mobility comments: partial turn to pt L side, HOB elevated. min/guard for safety ?  ? ?Transfers ?Overall transfer level: Needs assistance ?Equipment used: Rolling walker (2 wheels) ?Transfers: Sit to/from Stand ?Sit to Stand: Min assist ?  ?  ?  ?  ?  ?General transfer comment: verbal cues for hand placement and to control descent, min assist to steady on rising and to transition to RW ?  ? ?Ambulation/Gait ?Ambulation/Gait assistance: Min assist ?Gait Distance (Feet): 40 Feet ?Assistive device: Rolling walker (2 wheels) ?Gait Pattern/deviations: Step-through pattern, Decreased stride length, Narrow base of support ?  ?  ?  ?General Gait Details: multi-modal cues for trunk extension as able, RW position from self and step length. assist to balance and safely maneuver RW ? ? ?Stairs ?  ?  ?  ?  ?  ? ? ?Wheelchair Mobility ?  ? ?Modified Rankin (Stroke Patients Only) ?  ? ? ?  ?Balance   ?Sitting-balance support: Feet supported ?Sitting balance-Leahy Scale: Good ?  ?  ?Standing balance support: Reliant on assistive device for balance, During functional activity, Bilateral upper extremity supported ?Standing balance-Leahy Scale: Poor ?  ?  ?  ?  ?  ?  ?  ?  ?  ?  ?  ?  ?  ? ?  ?Cognition Arousal/Alertness: Awake/alert ?Behavior During Therapy: [facility] for tasks assessed/performed ?Overall Cognitive Status: Within Functional Limits for tasks assessed ?  ?  ?  ?  ?  ?  ?  ?  ?  ?  ?  ?  ?  ?  ?  ?  ?  General Comments: Patient is very [place] however able to follow directions appropriately ?  ?  ? ?  ?Exercises Total Joint Exercises ?Ankle Circles/Pumps: AROM, Both, 5 reps ?Long Arc Quad: AROM, Both, 5 reps ? ?  ?General Comments   ?  ?  ? ?Pertinent Vitals/Pain Pain Assessment ?Pain Assessment: No/denies pain  ? ? ?Home Living   ?  ?  ?  ?  ?  ?  ?  ?  ?  ?    ?  ?Prior Function    ?  ?  ?   ? ?PT Goals (current goals can now be found in the care plan section) Acute Rehab PT Goals ?Patient Stated Goal: To walk without fatigue ?PT Goal Formulation: With patient ?Time For Goal Achievement: 05/28/21 ?Potential to Achieve Goals: Good ?Progress towards PT goals: Progressing toward goals ? ?  ?Frequency ? ? ? Min 2X/week ? ? ? ?  ?PT Plan Current plan remains appropriate  ? ? ?Co-evaluation   ?  ?  ?  ?  ? ?  ?AM-PAC PT "6 Clicks" Mobility   ?Outcome Measure ? Help needed turning from your back to your side while in a flat bed without using bedrails?: A Little ?Help needed moving from lying on your back to sitting on the side of a flat bed without using bedrails?: A Little ?Help needed moving to and from a bed to a chair (including a wheelchair)?: A Little ?Help needed standing up from a chair using your arms (e.g., wheelchair or bedside chair)?: A Little ?Help needed to walk in hospital room?: A Little ?Help needed climbing 3-5 steps with a railing? : A Lot ?6 Click Score: 17 ? ?  ?End of Session Equipment Utilized During Treatment: Gait belt;Other (comment) (TLSO) ?Activity Tolerance: Patient tolerated treatment well ?Patient left: in chair;with call bell/phone within reach;with chair alarm set ?  ?PT Visit Diagnosis: History of falling (Z91.81);Difficulty in walking, not elsewhere classified (R26.2) ?  ? ? ?Time: 2956-2130 ?PT Time Calculation (min) (ACUTE ONLY): 24 min ? ?Charges:  $Gait Training: 23-37 mins          ?          ? Baxter Flattery, PT ? ?Acute Rehab Dept [facility]) [phone] ?Pager [phone] ? ?05/17/2021 ? ? ? ?Elsye Mccollister ?05/17/2021, 12:14 PM ? ?

## 2021-05-17 NOTE — Progress Notes (Signed)
?Radiation Oncology         [phone] ?________________________________ ? ?Name: Martin Foster MRN: [id number]  ?Date: 05/13/2021  DOB: Dec 30, 1925 ? ?INPATIENT ? ?Follow-Up Visit Note ? ?CC: [name], MD  No ref. provider found ? 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 ?  ICD-10-CM   ?1. Acute cystitis without hematuria  N30.00   ?  ?2. Weakness  R53.1   ?  ? ? ?Interval Since Last Radiation:  1 months ?04/08/21- 04/14/21  The target at T7 was treated to 20 Gy in 5 fractions of 4 Gy ? ?Narrative:  He was admitted unable to stand and walk with UTI.  MRI spine shows no cord compression despite metastases at T3, T9, T8 and T12                      ? ?ALLERGIES:  is allergic to lisinopril, olmesartan medoxomil-hctz, ampicillin, aspartame and phenylalanine, ciprofibrate, ciprofloxacin, erythromycin, and fd&c yellow #6 [yellow dye]. ? ?Meds: ?Current Facility-Administered Medications  ?Medication Dose Route Frequency Provider Last Rate Last Admin  ? 0.9 %  sodium chloride infusion  250 mL Intravenous PRN Regalado, Belkys A, MD      ? acetaminophen (TYLENOL) tablet 650 mg  650 mg Oral Q6H PRN Regalado, Belkys A, MD      ? Or  ? acetaminophen (TYLENOL) suppository 650 mg  650 mg Rectal Q6H PRN Regalado, Belkys A, MD      ? cefTRIAXone (ROCEPHIN) 2 g in sodium chloride 0.9 % 100 mL IVPB  2 g Intravenous Q24H Regalado, Belkys A, MD   Stopped at 05/16/21 1455  ? Chlorhexidine Gluconate Cloth 2 % PADS 6 each  6 each Topical Daily Regalado, Belkys A, MD   6 each at 05/16/21 1000  ? dexamethasone (DECADRON) injection 10 mg  10 mg Intravenous Q6H Regalado, Belkys A, MD   10 mg at 05/17/21 0612  ? enoxaparin (LOVENOX) injection 40 mg  40 mg Subcutaneous Q24H Shade, Christine E, RPH   40 mg at 05/16/21 2101  ? feeding supplement (ENSURE ENLIVE / ENSURE PLUS) liquid 237 mL  237 mL Oral BID BM Regalado, Belkys A, MD   237 mL at 05/16/21 1500  ? hydrALAZINE  (APRESOLINE) injection 5 mg  5 mg Intravenous Q6H PRN Regalado, Belkys A, MD   5 mg at 05/14/21 1102  ? losartan (COZAAR) tablet 100 mg  100 mg Oral Daily Regalado, Belkys A, MD   100 mg at 05/16/21 1124  ? morphine (PF) 2 MG/ML injection 2 mg  2 mg Intravenous Q4H PRN Regalado, Belkys A, MD      ? ondansetron (ZOFRAN) tablet 4 mg  4 mg Oral Q6H PRN Regalado, Belkys A, MD      ? Or  ? ondansetron (ZOFRAN) injection 4 mg  4 mg Intravenous Q6H PRN Regalado, Belkys A, MD      ? oxyCODONE (Oxy IR/ROXICODONE) immediate release tablet 5-10 mg  5-10 mg Oral Q6H Regalado, Belkys A, MD   5 mg at 05/17/21 0612  ? polyethylene glycol (MIRALAX / GLYCOLAX) packet 17 g  17 g Oral Daily Regalado, Belkys A, MD   17 g at 05/16/21 1124  ? senna-docusate (Senokot-S) tablet 1 tablet  1 tablet Oral BID Regalado, Belkys A, MD   1 tablet at 05/16/21 1124  ? sodium chloride flush (NS) 0.9 % injection 3 mL  3 mL Intravenous Q12H Regalado,  Belkys A, MD   3 mL at 05/16/21 2102  ? sodium chloride flush (NS) 0.9 % injection 3 mL  3 mL Intravenous PRN Regalado, Belkys A, MD      ? tamsulosin (FLOMAX) capsule 0.4 mg  0.4 mg Oral Daily Regalado, Belkys A, MD   0.4 mg at 05/16/21 1124  ? vitamin B-12 (CYANOCOBALAMIN) tablet 1,000 mcg  1,000 mcg Oral Daily Regalado, Belkys A, MD   1,000 mcg at 05/16/21 1124  ? ? ?Physical Findings: ?The patient is in no acute distress. Patient is alert and oriented. ? height is '5\' 6"'$  (1.676 m) and weight is 143 lb 11.8 oz (65.2 kg). His oral temperature is 97.6 ?F (36.4 ?C). His blood pressure is 152/79 (abnormal) and his pulse is 86. His respiration is 18 and oxygen saturation is 98%. .   ? ?He has no numbness to light touch in the lower extremities, so there is no 'nerve level' to suggest spinal cord compression.  His Motor strength is bilaterally 4+/5 in the lower extremities, able to lift feet off bed. ?. ? ?Lab Findings: ?Lab Results  ?Component Value Date  ? WBC 12.7 (H) 05/16/2021  ? HGB 10.4 (L) 05/16/2021   ? HCT 31.4 (L) 05/16/2021  ? PLT 381 05/16/2021  ? ? ?Lab Results  ?Component Value Date  ? NA 135 05/16/2021  ? K 4.3 05/16/2021  ? CO2 25 05/16/2021  ? GLUCOSE 198 (H) 05/16/2021  ? BUN 37 (H) 05/16/2021  ? CREATININE 0.93 05/16/2021  ? BILITOT 0.7 05/14/2021  ? ALKPHOS 73 05/14/2021  ? AST 20 05/14/2021  ? ALT 16 05/14/2021  ? PROT 6.9 05/14/2021  ? ALBUMIN 2.8 (L) 05/14/2021  ? CALCIUM 9.3 05/16/2021  ? ANIONGAP 10 05/16/2021  ? ? ?Radiographic Findings: ?CT Thoracic Spine Wo Contrast ? ?Result Date: 05/13/2021 ?CLINICAL DATA:  Back trauma, no prior imaging (Age >= 16y) EXAM: CT THORACIC SPINE WITHOUT CONTRAST TECHNIQUE: Multidetector CT images of the thoracic were obtained using the standard protocol without intravenous contrast. RADIATION DOSE REDUCTION: This exam was performed according to the departmental dose-optimization program which includes automated exposure control, adjustment of the mA and/or kV according to patient size and/or use of iterative reconstruction technique. COMPARISON:  CT 03/05/2020 FINDINGS: Alignment: Dextroconvex curvature of the thoracic spine. No static listhesis. Vertebrae: There is osseous metastatic disease involving the right transverse process and pedicle of T1, the T3 vertebral body and posterior elements on the left, T7 and left posterior elements, T11 vertebral body, and T12. There is progressive height loss of T7 posteriorly in comparison to prior PET-CT. There is progressive height loss of the T7 vertebral body posteriorly, with up to 50% height loss, previously 25%. The T7 lesion encroaches upon the spinal canal and left neural foramen. Unchanged anterior height loss of T12. Other osseous metastatic lesions are similar. There is a new anterior compression fracture of T8, presumably pathologic, with up to 25% height loss. Paraspinal and other soft tissues: Small bilateral pleural effusions. Aortic atherosclerosis. Coronary artery calcifications. Disc levels: There is  multilevel degenerative disc disease and facet arthropathy most severe the upper and lower thoracic spine and at T8-T9. IMPRESSION: Osseous metastatic disease in the thoracic spine with increased, progressive height loss of T7, now 50%, previously 25%. There is encroachment of the T7 metastatic lesion upon the spinal canal and left neural foramen. Other osseous metastatic lesions are similar to recent PET-CT. New anterior compression fracture of T8, presumably pathologic, with up to 25% height loss.  Electronically Signed   By: Maurine Simmering M.D.   On: 05/13/2021 17:41  ? ?CT Lumbar Spine Wo Contrast ? ?Result Date: 05/13/2021 ?CLINICAL DATA:  Back trauma, no prior imaging (Age >= 16y); fall EXAM: CT LUMBAR SPINE WITHOUT CONTRAST TECHNIQUE: Multidetector CT imaging of the lumbar spine was performed without intravenous contrast administration. Multiplanar CT image reconstructions were also generated. RADIATION DOSE REDUCTION: This exam was performed according to the departmental dose-optimization program which includes automated exposure control, adjustment of the mA and/or kV according to patient size and/or use of iterative reconstruction technique. COMPARISON:  Correlation made with CT abdomen 04/03/2021 FINDINGS: Segmentation: 5 lumbar type vertebrae. Alignment: Stable.  Levocurvature and mild degenerative listhesis. Vertebrae: Stable vertebral body heights. No acute fracture identified. Multilevel degenerative endplate irregularity. Known metastatic disease at T12. Decreased osseous mineralization. Paraspinal and other soft tissues: No acute abnormality. Disc levels: Advanced multilevel degenerative changes with disc space narrowing, disc bulges and endplate osteophytes, facet hypertrophy, and ligamentum flavum thickening. Canal stenosis is greatest at L3-L4. There is multilevel significant foraminal narrowing. IMPRESSION: No acute lumbar spine fracture. Electronically Signed   By: Macy Mis M.D.   On:  05/13/2021 14:13  ? ?MR TOTAL SPINE METS SCREENING ? ?Result Date: 05/14/2021 ?CLINICAL DATA:  Metastatic prostate carcinoma EXAM: MRI TOTAL SPINE WITHOUT AND WITH CONTRAST TECHNIQUE: Multisequence MR imaging of the spine from

## 2021-05-18 DIAGNOSIS — R531 Weakness: Secondary | ICD-10-CM | POA: Diagnosis not present

## 2021-05-18 DIAGNOSIS — R339 Retention of urine, unspecified: Secondary | ICD-10-CM | POA: Diagnosis not present

## 2021-05-18 DIAGNOSIS — M6281 Muscle weakness (generalized): Secondary | ICD-10-CM | POA: Diagnosis not present

## 2021-05-18 DIAGNOSIS — R600 Localized edema: Secondary | ICD-10-CM | POA: Diagnosis not present

## 2021-05-18 DIAGNOSIS — S22060D Wedge compression fracture of T7-T8 vertebra, subsequent encounter for fracture with routine healing: Secondary | ICD-10-CM | POA: Diagnosis not present

## 2021-05-18 DIAGNOSIS — E559 Vitamin D deficiency, unspecified: Secondary | ICD-10-CM | POA: Diagnosis not present

## 2021-05-18 DIAGNOSIS — N3000 Acute cystitis without hematuria: Secondary | ICD-10-CM | POA: Diagnosis not present

## 2021-05-18 DIAGNOSIS — R2681 Unsteadiness on feet: Secondary | ICD-10-CM | POA: Diagnosis not present

## 2021-05-18 DIAGNOSIS — D649 Anemia, unspecified: Secondary | ICD-10-CM | POA: Diagnosis not present

## 2021-05-18 DIAGNOSIS — C7982 Secondary malignant neoplasm of genital organs: Secondary | ICD-10-CM | POA: Diagnosis not present

## 2021-05-18 DIAGNOSIS — E43 Unspecified severe protein-calorie malnutrition: Secondary | ICD-10-CM | POA: Diagnosis not present

## 2021-05-18 DIAGNOSIS — R293 Abnormal posture: Secondary | ICD-10-CM | POA: Diagnosis not present

## 2021-05-18 DIAGNOSIS — S22069S Unspecified fracture of T7-T8 vertebra, sequela: Secondary | ICD-10-CM | POA: Diagnosis not present

## 2021-05-18 DIAGNOSIS — C799 Secondary malignant neoplasm of unspecified site: Secondary | ICD-10-CM | POA: Diagnosis not present

## 2021-05-18 DIAGNOSIS — N390 Urinary tract infection, site not specified: Secondary | ICD-10-CM | POA: Diagnosis not present

## 2021-05-18 DIAGNOSIS — Z7401 Bed confinement status: Secondary | ICD-10-CM | POA: Diagnosis not present

## 2021-05-18 DIAGNOSIS — K5900 Constipation, unspecified: Secondary | ICD-10-CM | POA: Diagnosis not present

## 2021-05-18 DIAGNOSIS — N139 Obstructive and reflux uropathy, unspecified: Secondary | ICD-10-CM | POA: Diagnosis not present

## 2021-05-18 DIAGNOSIS — Z743 Need for continuous supervision: Secondary | ICD-10-CM | POA: Diagnosis not present

## 2021-05-18 DIAGNOSIS — R262 Difficulty in walking, not elsewhere classified: Secondary | ICD-10-CM | POA: Diagnosis not present

## 2021-05-18 DIAGNOSIS — R278 Other lack of coordination: Secondary | ICD-10-CM | POA: Diagnosis not present

## 2021-05-18 DIAGNOSIS — I10 Essential (primary) hypertension: Secondary | ICD-10-CM | POA: Diagnosis not present

## 2021-05-18 DIAGNOSIS — S32000A Wedge compression fracture of unspecified lumbar vertebra, initial encounter for closed fracture: Secondary | ICD-10-CM | POA: Diagnosis not present

## 2021-05-18 DIAGNOSIS — E119 Type 2 diabetes mellitus without complications: Secondary | ICD-10-CM | POA: Diagnosis not present

## 2021-05-18 MED ORDER — POLYETHYLENE GLYCOL 3350 17 G PO PACK: 17.0000 g | PACK | Freq: Every day | ORAL | 0 refills | Status: DC

## 2021-05-18 MED ORDER — CYANOCOBALAMIN 1000 MCG PO TABS: 1000.0000 ug | ORAL_TABLET | Freq: Every day | ORAL | 0 refills | Status: DC

## 2021-05-18 MED ORDER — OXYCODONE HCL 5 MG PO TABS
5.0000 mg | ORAL_TABLET | Freq: Four times a day (QID) | ORAL | 0 refills | Status: DC
Start: 2021-05-18 — End: 2021-05-18

## 2021-05-18 MED ORDER — CEPHALEXIN 500 MG PO CAPS: 500.0000 mg | ORAL_CAPSULE | Freq: Three times a day (TID) | ORAL | 0 refills | Status: AC

## 2021-05-18 MED ORDER — SENNOSIDES-DOCUSATE SODIUM 8.6-50 MG PO TABS: 1.0000 | ORAL_TABLET | Freq: Two times a day (BID) | ORAL | 0 refills | Status: DC

## 2021-05-18 MED ORDER — OXYCODONE HCL 5 MG PO TABS: 5.0000 mg | ORAL_TABLET | Freq: Four times a day (QID) | ORAL | 0 refills | Status: DC

## 2021-05-18 MED ORDER — CEPHALEXIN 500 MG PO CAPS: 500.0000 mg | ORAL_CAPSULE | Freq: Three times a day (TID) | ORAL | 0 refills | Status: DC

## 2021-05-18 MED ORDER — DEXAMETHASONE 4 MG PO TABS
4.0000 mg | ORAL_TABLET | Freq: Three times a day (TID) | ORAL | 0 refills | Status: DC
Start: 2021-05-18 — End: 2022-11-29

## 2021-05-18 NOTE — Care Management Important Message (Signed)
Important Message ? ?Patient Details IM Letter placed in Patients room. ?Name: Martin Foster ?MRN: [id number] ?Date of Birth: 04-28-1925 ? ? ?Medicare Important Message Given:  Yes ? ? ? ? ?Kerin Salen ?05/18/2021, 9:37 AM ?

## 2021-05-18 NOTE — Progress Notes (Signed)
Occupational Therapy Treatment ?Patient Details ?Name: Martin Foster ?MRN: [id number] ?DOB: 06/06/1925 ?Today's Date: 05/18/2021 ? ? ?History of present illness Pt is a 88yo male presenting to [facility] ED on 05/13/21 with inability to walk with  pain and weakness in BLEs and hip, fell at home in bathroom and called 911 to come to [facility]. CT 5/4 found metastatic disease in T7 with L foraminal compression and new anterior compression fracture of T8. Found to have UTI. Of note: currently undergoing radiation.  PMH: HTN, hx of prostate and rectal cancer. ?  ?OT comments ? Patient very appreciative and agreeable to therapy session. Overall min G assist for bed mobility and ambulation first to sink to perform oral care, then to recliner chair. Patient needing verbal cues for sequencing turning walker and for hand placement not to pull on walker to stand. Patient needing increased time for all mobility taking small steps but no overt loss of balance. Patient reports plan is to D/C to rehab today, updated D/C recommendations. Acute OT to follow.   ? ?Recommendations for follow up therapy are one component of a multi-disciplinary discharge planning process, led by the attending physician.  Recommendations may be updated based on patient status, additional functional criteria and insurance authorization. ?   ?Follow Up Recommendations ? Skilled nursing-short term rehab (<3 hours/day)  ?  ?Assistance Recommended at Discharge Frequent or constant Supervision/Assistance  ?Patient can return home with the following ? A little help with walking and/or transfers;A little help with bathing/dressing/bathroom;Help with stairs or ramp for entrance;Assistance with cooking/housework ?  ?Equipment Recommendations ? Tub/shower seat  ?  ?   ?Precautions / Restrictions Precautions ?Precautions: Fall ?Precaution Comments: Recent fall that lead to this hospital admission. pt ahs TLSO (had apparently prior to admission) no orders this admission as to  parameters of brace. donned TLSO in sitting and reviewed doffing with pt as well. ?Required Braces or Orthoses: Spinal Brace ?Spinal Brace: Thoracolumbosacral orthotic ?Restrictions ?Weight Bearing Restrictions: No  ? ? ?  ? ?Mobility Bed Mobility ?Overal bed mobility: Needs Assistance ?Bed Mobility: Rolling, Sidelying to Sit ?Rolling: Min guard ?Sidelying to sit: Min guard, HOB elevated ?  ?  ?  ?General bed mobility comments: Min G for safety ?  ? ? ?  ?Balance Overall balance assessment: Needs assistance, History of Falls ?Sitting-balance support: Feet supported ?Sitting balance-Leahy Scale: Good ?  ?  ?Standing balance support: Single extremity supported, Bilateral upper extremity supported, During functional activity ?Standing balance-Leahy Scale: Poor ?  ?  ?  ?  ?  ?  ?  ?  ?  ?  ?  ?  ?   ? ?ADL either performed or assessed with clinical judgement  ? ?ADL Overall ADL's : Needs assistance/impaired ?  ?  ?Grooming: Oral care;Wash/dry face;Set up;Min guard;Sitting;Standing ?Grooming Details (indicate cue type and reason): Patient tolerate standing at sink for ~3 minutes to brush his teeth. Washed his face in sitting for rest break. No loss of balance in standing however patient does rely on sink for support ?  ?  ?  ?  ?  ?  ?Lower Body Dressing: Supervision/safety;Sitting/lateral leans ?Lower Body Dressing Details (indicate cue type and reason): Patient able to roll down socks while seated in recliner chair ?Toilet Transfer: Min guard;Cueing for safety;Cueing for sequencing;Ambulation;Rolling walker (2 wheels) ?Toilet Transfer Details (indicate cue type and reason): Patient needing verbal cues how to sequence turning with walker first to sink then to transfer to recliner chair. No loss  of balance however patient does take small steps and needs increased time. ?  ?  ?  ?  ?Functional mobility during ADLs: Min guard;Cueing for safety;Cueing for sequencing;Rolling walker (2 wheels) ?  ?  ? ? ? ?Cognition  Arousal/Alertness: Awake/alert ?Behavior During Therapy: [facility] for tasks assessed/performed ?Overall Cognitive Status: Within Functional Limits for tasks assessed ?  ?  ?  ?  ?  ?  ?  ?  ?  ?  ?  ?  ?  ?  ?  ?  ?General Comments: Patient very pleasant and appreciative ?  ?  ?   ?   ?   ?   ? ? ?Pertinent Vitals/ Pain       Pain Assessment ?Pain Assessment: Faces ?Faces Pain Scale: No hurt ? ?   ?   ? ?Frequency ? Min 2X/week  ? ? ? ? ?  ?Progress Toward Goals ? ?OT Goals(current goals can now be found in the care plan section) ? Progress towards OT goals: Progressing toward goals ? ?Acute Rehab OT Goals ?Patient Stated Goal: Go to rehab today ?OT Goal Formulation: With patient ?Time For Goal Achievement: 05/28/21 ?Potential to Achieve Goals: Good ?ADL Goals ?Pt Will Perform Lower Body Dressing: with modified independence;sit to/from stand;sitting/lateral leans (A/E as needed) ?Pt Will Transfer to Toilet: with modified independence;ambulating;regular height toilet (walker) ?Pt Will Perform Toileting - Clothing Manipulation and hygiene: with modified independence;sitting/lateral leans;sit to/from stand ?Additional ADL Goal #1: Patient will tolerate 8 minutes of dynamic standing activity in order to participate in self care tasks.  ?Plan Discharge plan needs to be updated   ? ?   ?AM-PAC OT "6 Clicks" Daily Activity     ?Outcome Measure ? ? Help from another person eating meals?: None ?Help from another person taking care of personal grooming?: A Little ?Help from another person toileting, which includes using toliet, bedpan, or urinal?: A Little ?Help from another person bathing (including washing, rinsing, drying)?: A Little ?Help from another person to put on and taking off regular upper body clothing?: A Little ?Help from another person to put on and taking off regular lower body clothing?: A Little ?6 Click Score: 19 ? ?  ?End of Session Equipment Utilized During Treatment: Rolling walker (2 wheels) ? ?OT Visit  Diagnosis: Unsteadiness on feet (R26.81);History of falling (Z91.81) ?  ?Activity Tolerance Patient tolerated treatment well ?  ?Patient Left in chair;with call bell/phone within reach;with chair alarm set ?  ?Nurse Communication Mobility status ?  ? ?   ? ?Time: 7672-0947 ?OT Time Calculation (min): 27 min ? ?Charges: OT General Charges ?$OT Visit: 1 Visit ?OT Treatments ?$Self Care/Home Management : 23-37 mins ? ?Delbert Phenix OT ?OT pager: [phone] ? ? ?Rosemary Holms ?05/18/2021, 12:05 PM ?

## 2021-05-18 NOTE — Progress Notes (Signed)
Palliative: ? ?Multiple attempts to reach wife, [name], to discuss path forward but unable to reach. [name] was Mr. Thurmond's preferred point of contact. He is discharging the hospital for rehab today and I recommend outpatient palliative to follow and continue the conversation to consider hospice support at home after rehab stay. Martin Foster seems to understand that he may be best benefiting from pain medication and steroids and that the interventions that are making him feel the best may be best managed by hospice and is open to consideration but wants his wife's input.  ? ?No charge ? ?Vinie Sill, NP ?Palliative Medicine Team ?Pager [phone] (Please see [email or web address] for schedule) ?Team Phone [phone]  ? ?

## 2021-05-18 NOTE — TOC Progression Note (Addendum)
Transition of Care (TOC) - Progression Note  ? ? ?Patient Details  ?Name: Martin Foster ?MRN: [id number] ?Date of Birth: 07-11-1925 ? ?Transition of Care (TOC) CM/SW Contact  ?Sandro Burgo, Marjie Skiff, RN ?Phone Number: ?05/18/2021, 11:12 AM ? ?Clinical Narrative:    ?SNF bed offers provided to pt wife and Ritta Slot chosen. [facility] liaison contacted to confirm bed for today. ? ?Insurance auth completed with [employer]. Auth approved 5/9-5/11 [id number].  ? ?Spoke with pt and wife to alert of transport to [facility] room 3238 today. Yellow DNR on the chart for transport. RN to call report to [phone]. ? ? ?Expected Discharge Plan: [place] ?Barriers to Discharge: Continued Medical Work up ? ?Expected Discharge Plan and Services ?Expected Discharge Plan: [place] ?  ?Discharge Planning Services: CM Consult ?  ?Living arrangements for the past 2 months: [place] ?Expected Discharge Date: 05/18/21               ?  ?  ?  ?  ?  ?  ?  ?  ?  ?  ? ? ?Social Determinants of Health (SDOH) Interventions ?  ? ?Readmission Risk Interventions ?   ? View : No data to display.  ?  ?  ?  ? ? ?

## 2021-05-18 NOTE — Discharge Summary (Signed)
Physician Discharge Summary  Bob Lagrave LKG:[id number] DOB: 01-17-1925 DOA: 05/13/2021  PCP: Mila Palmer, MD  Admit date: 05/13/2021 Discharge date: 05/18/2021  Admitted From: Home  Disposition: SNF with Palliative care   Recommendations for Outpatient Follow-up:  Follow up with PCP in 1-2 weeks Please obtain BMP/CBC in one week Please consult palliative care for pain management.   Home Health: None  Discharge Condition: Stable.  CODE STATUS: DNR Diet recommendation: Heart Healthy  Brief/Interim Summary: Martin Foster is a 88 y.o. 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 Rickard Rhymes  for radiation treatment, he completed 5 radiation treatments.  He presents today with inability to walk, due to pain and weakness in his legs and hip.  He presented initially confused in the ED due to severe pain.  After a dose of oral oxycodone his pain has improved, but he was not able to ambulate.   He was also found to have a UTI, he reported some dysuria and suprapubic pain.  UA with significant nitrates and 21-50 white blood cells.   Found to have multiples new metastatic lesion Spine.   1-UTI: Patient presented with dysuria, UA with positive nitrates and 21-50 white blood cell. Follow urine culture. Multiples species.  Continue With IV ceftriaxone. Day 4 He was having issues with urine retention. Foley catheter placed 5/06. He will probably need 7 days course of antibiotics. He can be discharge on oral antibiotics. Discharge on Keflex for 3 more days.    2-Weakness, inability to ambulate: CT spine showed worsening compression fracture T 7 and new compression fracture T 8.  PT evaluation, recommending SNF>  B; 12: 361.   3-Metastatic prostate cancer to T7; New T 8 compression fracture.  He completed 5 treatments of radiation followed by Dr. Kathrynn Running Continue with oxycodone as needed. Continue with Flomax.  CT Thoracic: Osseous  metastatic disease in the thoracic spine with increased,  progressive height loss of T7, now 50%, previously 25%. There is encroachment of the T7 metastatic lesion upon the spinal canal and left neural foramen. Other osseous metastatic lesions are similar to recent PET-CT. New anterior compression fracture of T8, presumably pathologic, with up to 25% height loss. Discussed CT result with Dr Kathrynn Running 5/05; He recommend MRI. Further recommendation depending on results.  MRI: Osseous metastases at T3, T7, T8, T11 and T12. Moderate spinal canal stenosis at T3-4 and T8 due to metastatic disease. Increased contrast enhancement along the dorsal aspect of the T12 vertebral body may indicate dilated venous plexus or epidural extension of metastatic disease. -Discussed MRI results with Dr. Roselind Messier 5/06, he recommend IV decadron and patient might need further radiation.  -IV decadron.  Pain better.  Appreciate Dr Kathrynn Running, no plan for radiation treatment. Palliative care consultation.  Continue pain management with oxycodone and decadron.  He will need palliative care follow up.    4-Hypertension: PRN hydralazine order.  On Cozaar.    5-Left lower extremity edema: Doppler Negative for DVT 6-Constipation. Miralax and senna. Having BM  7-Leukocytosis; likely related to IV steroids.       Discharge Diagnoses:  Principal Problem:   UTI (urinary tract infection) Active Problems:   Prostate cancer metastatic to bone [facility]   Hypertension   Weakness    Discharge Instructions  Discharge Instructions     Diet - low sodium heart healthy   Complete by: As directed    Increase activity slowly   Complete by: As directed  Allergies as of 05/18/2021       Reactions   Lisinopril Swelling   Facial swelling   Olmesartan Medoxomil-hctz Swelling   Facial swelling   Ampicillin Swelling   Facial swelling   Aspartame And Phenylalanine Swelling   Facial swelling   Ciprofibrate Swelling   Facial  swelling   Ciprofloxacin Swelling   Facial and leg swelling   Erythromycin Swelling   Facial swelling   Fd&c Yellow #6 [yellow Dye] Other (See Comments)   Unknown reaction        Medication List     STOP taking these medications    morphine 15 MG tablet Commonly known as: MSIR       TAKE these medications    cephALEXin 500 MG capsule Commonly known as: KEFLEX Take 1 capsule (500 mg total) by mouth 3 (three) times daily for 3 days.   cholecalciferol 25 MCG (1000 UNIT) tablet Commonly known as: VITAMIN D3 Take 1,000 Units by mouth daily.   cyanocobalamin 1000 MCG tablet Take 1 tablet (1,000 mcg total) by mouth daily.   dexamethasone 4 MG tablet Commonly known as: DECADRON Take 1 tablet (4 mg total) by mouth 3 (three) times daily.   ferrous sulfate 325 (65 FE) MG tablet Take 325 mg by mouth daily.   losartan 100 MG tablet Commonly known as: COZAAR Take 100 mg by mouth daily.   LUPRON [place] Inject 1 Dose as directed every 6 (six) months.   multivitamin with minerals Tabs tablet Take 1 tablet by mouth daily.   OSTEO BI-FLEX ADV DOUBLE ST PO Take 1 tablet by mouth daily.   oxyCODONE 5 MG immediate release tablet Commonly known as: Oxy IR/ROXICODONE Take 1-2 tablets (5-10 mg total) by mouth every 6 (six) hours.   polyethylene glycol 17 g packet Commonly known as: MIRALAX / GLYCOLAX Take 17 g by mouth daily.   senna-docusate 8.6-50 MG tablet Commonly known as: Senokot-S Take 1 tablet by mouth 2 (two) times daily.   tamsulosin 0.4 MG Caps capsule Commonly known as: FLOMAX Take 0.4 mg by mouth daily after supper.   Vitamin C 500 MG Chew Chew 500 mg by mouth daily.   vitamin E 180 MG (400 UNITS) capsule Take 400 Units by mouth daily.        Allergies  Allergen Reactions   Lisinopril Swelling    Facial swelling   Olmesartan Medoxomil-Hctz Swelling    Facial swelling   Ampicillin Swelling    Facial swelling   Aspartame And Phenylalanine  Swelling    Facial swelling   Ciprofibrate Swelling    Facial swelling   Ciprofloxacin Swelling    Facial and leg swelling   Erythromycin Swelling    Facial swelling   Fd&C Yellow #6 [Yellow Dye] Other (See Comments)    Unknown reaction    Consultations: Radiation Oncology    Procedures/Studies: CT Thoracic Spine Wo Contrast  Result Date: 05/13/2021 CLINICAL DATA:  Back trauma, no prior imaging (Age >= 16y) EXAM: CT THORACIC SPINE WITHOUT CONTRAST TECHNIQUE: Multidetector CT images of the thoracic were obtained using the standard protocol without intravenous contrast. RADIATION DOSE REDUCTION: This exam was performed according to the departmental dose-optimization program which includes automated exposure control, adjustment of the mA and/or kV according to patient size and/or use of iterative reconstruction technique. COMPARISON:  CT 03/05/2020 FINDINGS: Alignment: Dextroconvex curvature of the thoracic spine. No static listhesis. Vertebrae: There is osseous metastatic disease involving the right transverse process and pedicle of T1, the T3  vertebral body and posterior elements on the left, T7 and left posterior elements, T11 vertebral body, and T12. There is progressive height loss of T7 posteriorly in comparison to prior PET-CT. There is progressive height loss of the T7 vertebral body posteriorly, with up to 50% height loss, previously 25%. The T7 lesion encroaches upon the spinal canal and left neural foramen. Unchanged anterior height loss of T12. Other osseous metastatic lesions are similar. There is a new anterior compression fracture of T8, presumably pathologic, with up to 25% height loss. Paraspinal and other soft tissues: Small bilateral pleural effusions. Aortic atherosclerosis. Coronary artery calcifications. Disc levels: There is multilevel degenerative disc disease and facet arthropathy most severe the upper and lower thoracic spine and at T8-T9. IMPRESSION: Osseous metastatic  disease in the thoracic spine with increased, progressive height loss of T7, now 50%, previously 25%. There is encroachment of the T7 metastatic lesion upon the spinal canal and left neural foramen. Other osseous metastatic lesions are similar to recent PET-CT. New anterior compression fracture of T8, presumably pathologic, with up to 25% height loss. Electronically Signed   By: Caprice Renshaw M.D.   On: 05/13/2021 17:41   CT Lumbar Spine Wo Contrast  Result Date: 05/13/2021 CLINICAL DATA:  Back trauma, no prior imaging (Age >= 16y); fall EXAM: CT LUMBAR SPINE WITHOUT CONTRAST TECHNIQUE: Multidetector CT imaging of the lumbar spine was performed without intravenous contrast administration. Multiplanar CT image reconstructions were also generated. RADIATION DOSE REDUCTION: This exam was performed according to the departmental dose-optimization program which includes automated exposure control, adjustment of the mA and/or kV according to patient size and/or use of iterative reconstruction technique. COMPARISON:  Correlation made with CT abdomen 04/03/2021 FINDINGS: Segmentation: 5 lumbar type vertebrae. Alignment: Stable.  Levocurvature and mild degenerative listhesis. Vertebrae: Stable vertebral body heights. No acute fracture identified. Multilevel degenerative endplate irregularity. Known metastatic disease at T12. Decreased osseous mineralization. Paraspinal and other soft tissues: No acute abnormality. Disc levels: Advanced multilevel degenerative changes with disc space narrowing, disc bulges and endplate osteophytes, facet hypertrophy, and ligamentum flavum thickening. Canal stenosis is greatest at L3-L4. There is multilevel significant foraminal narrowing. IMPRESSION: No acute lumbar spine fracture. Electronically Signed   By: Guadlupe Spanish M.D.   On: 05/13/2021 14:13   MR TOTAL SPINE METS SCREENING  Result Date: 05/14/2021 CLINICAL DATA:  Metastatic prostate carcinoma EXAM: MRI TOTAL SPINE WITHOUT AND  WITH CONTRAST TECHNIQUE: Multisequence MR imaging of the spine from the cervical spine to the sacrum was performed prior to and following IV contrast administration for evaluation of spinal metastatic disease. CONTRAST:  6mL GADAVIST GADOBUTROL 1 MMOL/ML IV SOLN COMPARISON:  None Available. FINDINGS: Alignment: Physiologic. Vertebrae: There are osseous metastases at T3, T7, T8, T11 and T12. contrast enhancing lesions extend into the right pedicles of T3 and T8 in cause moderate attenuation of the thecal sac. There is increased contrast enhancement along the dorsal aspect of T12. Cord: Normal signal and morphology. Paraspinal tissues: Small pleural effusions. Otherwise limited visualization. Disc levels: Limited assessment of the disc levels due to motion degraded axial imaging. There is at least moderate narrowing of the spinal canal at T12-L1 and mild spinal canal stenosis at L1-2 and L2-3. There is multilevel severe foraminal stenosis at the lower lumbar levels. IMPRESSION: 1. Motion degraded study. 2. Osseous metastases at T3, T7, T8, T11 and T12. 3. Moderate spinal canal stenosis at T3-4 and T8 due to metastatic disease. 4. Increased contrast enhancement along the dorsal aspect of the T12  vertebral body may indicate dilated venous plexus or epidural extension of metastatic disease. 5. Multilevel severe foraminal stenosis at the lower lumbar levels. Electronically Signed   By: Deatra Robinson M.D.   On: 05/14/2021 20:44   VAS [place] LOWER EXTREMITY VENOUS (DVT)  Result Date: 05/14/2021  Lower Venous DVT Study Patient Name:  ALWALEED HODGEN [facility]  Date of Exam:   05/14/2021 Medical Rec #: [id number]       Accession #:    [id number] Date of Birth: [birth year]       Patient Gender: M Patient Age:   31 years Exam Location:  [facility] Procedure:      VAS [place] LOWER EXTREMITY VENOUS (DVT) Referring Phys: Jon Billings Adysson Revelle --------------------------------------------------------------------------------  Indications: Edema.   Comparison Study: No previous exams Performing Technologist: Jody Hill RVT, RDMS  Examination Guidelines: A complete evaluation includes B-mode imaging, spectral Doppler, color Doppler, and power Doppler as needed of all accessible portions of each vessel. Bilateral testing is considered an integral part of a complete examination. Limited examinations for reoccurring indications may be performed as noted. The reflux portion of the exam is performed with the patient in reverse Trendelenburg.  +-----+---------------+---------+-----------+----------+--------------+ RIGHTCompressibilityPhasicitySpontaneityPropertiesThrombus Aging +-----+---------------+---------+-----------+----------+--------------+ CFV  Full           Yes      Yes                                 +-----+---------------+---------+-----------+----------+--------------+   +---------+---------------+---------+-----------+----------+-------------------+ LEFT     CompressibilityPhasicitySpontaneityPropertiesThrombus Aging      +---------+---------------+---------+-----------+----------+-------------------+ CFV      Full           Yes      Yes                                      +---------+---------------+---------+-----------+----------+-------------------+ SFJ      Full                                                             +---------+---------------+---------+-----------+----------+-------------------+ FV Prox  Full           Yes      Yes                                      +---------+---------------+---------+-----------+----------+-------------------+ FV Mid   Full           Yes      Yes                                      +---------+---------------+---------+-----------+----------+-------------------+ FV DistalFull           Yes      Yes                                      +---------+---------------+---------+-----------+----------+-------------------+ PFV      Full                                                              +---------+---------------+---------+-----------+----------+-------------------+  POP      Full           Yes      Yes                                      +---------+---------------+---------+-----------+----------+-------------------+ PTV      Full                                                             +---------+---------------+---------+-----------+----------+-------------------+ PERO     Full                                         Not well visualized +---------+---------------+---------+-----------+----------+-------------------+     Summary: RIGHT: - No evidence of common femoral vein obstruction.  LEFT: - There is no evidence of deep vein thrombosis in the lower extremity. - There is no evidence of superficial venous thrombosis.  - No cystic structure found in the popliteal fossa. - Subcutaneous edema noted in area of calf and ankle.  *See table(s) above for measurements and observations. Electronically signed by Coral Else MD on 05/14/2021 at 8:16:49 PM.    Final      Subjective: Feeling well, had accident in bed, BM Back pain is controlled.   Discharge Exam: Vitals:   05/17/21 2052 05/18/21 0437  BP: (!) 186/103 135/68  Pulse: 90 63  Resp: 16 17  Temp: (!) 97.5 F (36.4 C) 97.6 F (36.4 C)  SpO2: 100% 95%     General: Pt is alert, awake, not in acute distress Cardiovascular: RRR, S1/S2 +, no rubs, no gallops Respiratory: CTA bilaterally, no wheezing, no rhonchi Abdominal: Soft, NT, ND, bowel sounds + Extremities: no edema, no cyanosis    The results of significant diagnostics from this hospitalization (including imaging, microbiology, ancillary and laboratory) are listed below for reference.     Microbiology: Recent Results (from the past 240 hour(s))  Urine Culture     Status: Abnormal   Collection Time: 05/13/21 12:12 PM   Specimen: Urine, Clean Catch  Result Value Ref Range Status   Specimen  Description   Final    URINE, CLEAN CATCH Performed at [facility], [address] [place], [place] [address]    Special Requests   Final    NONE Performed at [facility], [address] [place], [place] [address]    Culture MULTIPLE SPECIES PRESENT, SUGGEST RECOLLECTION (A)  Final   Report Status 05/15/2021 FINAL  Final  Resp Panel by RT-PCR (Flu A&B, Covid) Nasopharyngeal Swab     Status: None   Collection Time: 05/13/21 12:13 PM   Specimen: Nasopharyngeal Swab; Nasopharyngeal(NP) swabs in vial transport medium  Result Value Ref Range Status   SARS Coronavirus 2 by RT PCR NEGATIVE NEGATIVE Final    Comment: (NOTE) SARS-CoV-2 target nucleic acids are NOT DETECTED.  The SARS-CoV-2 RNA is generally detectable in upper respiratory specimens during the acute phase of infection. The lowest concentration of SARS-CoV-2 viral copies this assay can detect is 138 copies/mL. A negative result does not preclude SARS-Cov-2 infection and should not be used as the  sole basis for treatment or other patient management decisions. A negative result may occur with  improper specimen collection/handling, submission of specimen other than nasopharyngeal swab, presence of viral mutation(s) within the areas targeted by this assay, and inadequate number of viral copies(<138 copies/mL). A negative result must be combined with clinical observations, patient history, and epidemiological information. The expected result is Negative.  Fact Sheet for Patients:  BloggerCourse.com  Fact Sheet for Healthcare Providers:  SeriousBroker.it  This test is no t yet approved or cleared by the [place] FDA and  has been authorized for detection and/or diagnosis of SARS-CoV-2 by FDA under an Emergency Use Authorization (EUA). This EUA will remain  in effect (meaning this test can be used) for the duration of the COVID-19  declaration under Section 564(b)(1) of the Act, 21 U.S.C.section 360bbb-3(b)(1), unless the authorization is terminated  or revoked sooner.       Influenza A by PCR NEGATIVE NEGATIVE Final   Influenza B by PCR NEGATIVE NEGATIVE Final    Comment: (NOTE) The Xpert Xpress SARS-CoV-2/FLU/RSV plus assay is intended as an aid in the diagnosis of influenza from Nasopharyngeal swab specimens and should not be used as a sole basis for treatment. Nasal washings and aspirates are unacceptable for Xpert Xpress SARS-CoV-2/FLU/RSV testing.  Fact Sheet for Patients: BloggerCourse.com  Fact Sheet for Healthcare Providers: SeriousBroker.it  This test is not yet approved or cleared by the [place] FDA and has been authorized for detection and/or diagnosis of SARS-CoV-2 by FDA under an Emergency Use Authorization (EUA). This EUA will remain in effect (meaning this test can be used) for the duration of the COVID-19 declaration under Section 564(b)(1) of the Act, 21 U.S.C. section 360bbb-3(b)(1), unless the authorization is terminated or revoked.  Performed at [facility], [address] [place], [place] [address]      Labs: BNP (last 3 results) No results for input(s): BNP in the last 8760 hours. Basic Metabolic Panel: Recent Labs  Lab 05/13/21 1226 05/14/21 0543 05/16/21 0752  NA 135 136 135  K 3.8 3.8 4.3  CL 102 103 100  CO2 25 24 25   GLUCOSE 131* 128* 198*  BUN 18 22 37*  CREATININE 0.69 0.87 0.93  CALCIUM 9.1 8.9 9.3   Liver Function Tests: Recent Labs  Lab 05/14/21 0543  AST 20  ALT 16  ALKPHOS 73  BILITOT 0.7  PROT 6.9  ALBUMIN 2.8*   No results for input(s): LIPASE, AMYLASE in the last 168 hours. No results for input(s): AMMONIA in the last 168 hours. CBC: Recent Labs  Lab 05/13/21 1226 05/14/21 0543 05/16/21 0752  WBC 9.4 11.1* 12.7*  NEUTROABS 8.0*  --   --   HGB 10.5* 10.4* 10.4*   HCT 31.3* 32.6* 31.4*  MCV 91.5 95.3 92.4  PLT 336 349 381   Cardiac Enzymes: Recent Labs  Lab 05/13/21 1226  CKTOTAL 165   BNP: Invalid input(s): POCBNP CBG: No results for input(s): GLUCAP in the last 168 hours. D-Dimer No results for input(s): DDIMER in the last 72 hours. Hgb A1c No results for input(s): HGBA1C in the last 72 hours. Lipid Profile No results for input(s): CHOL, HDL, LDLCALC, TRIG, CHOLHDL, LDLDIRECT in the last 72 hours. Thyroid function studies No results for input(s): TSH, T4TOTAL, T3FREE, THYROIDAB in the last 72 hours.  Invalid input(s): FREET3 Anemia work up No results for input(s): VITAMINB12, FOLATE, FERRITIN, TIBC, IRON, RETICCTPCT in the last 72 hours. Urinalysis    Component Value  Date/Time   COLORURINE YELLOW 05/13/2021 1212   APPEARANCEUR HAZY (A) 05/13/2021 1212   LABSPEC 1.012 05/13/2021 1212   PHURINE 7.0 05/13/2021 1212   GLUCOSEU NEGATIVE 05/13/2021 1212   HGBUR NEGATIVE 05/13/2021 1212   BILIRUBINUR NEGATIVE 05/13/2021 1212   KETONESUR NEGATIVE 05/13/2021 1212   PROTEINUR NEGATIVE 05/13/2021 1212   UROBILINOGEN 0.2 08/19/2011 0206   NITRITE POSITIVE (A) 05/13/2021 1212   LEUKOCYTESUR SMALL (A) 05/13/2021 1212   Sepsis Labs Invalid input(s): PROCALCITONIN,  WBC,  LACTICIDVEN Microbiology Recent Results (from the past 240 hour(s))  Urine Culture     Status: Abnormal   Collection Time: 05/13/21 12:12 PM   Specimen: Urine, Clean Catch  Result Value Ref Range Status   Specimen Description   Final    URINE, CLEAN CATCH Performed at [facility], [address] [place], [place] [address]    Special Requests   Final    NONE Performed at [facility], [address], [place], [place] [address]    Culture MULTIPLE SPECIES PRESENT, SUGGEST RECOLLECTION (A)  Final   Report Status 05/15/2021 FINAL  Final  Resp Panel by RT-PCR (Flu A&B, Covid) Nasopharyngeal Swab     Status: None   Collection  Time: 05/13/21 12:13 PM   Specimen: Nasopharyngeal Swab; Nasopharyngeal(NP) swabs in vial transport medium  Result Value Ref Range Status   SARS Coronavirus 2 by RT PCR NEGATIVE NEGATIVE Final    Comment: (NOTE) SARS-CoV-2 target nucleic acids are NOT DETECTED.  The SARS-CoV-2 RNA is generally detectable in upper respiratory specimens during the acute phase of infection. The lowest concentration of SARS-CoV-2 viral copies this assay can detect is 138 copies/mL. A negative result does not preclude SARS-Cov-2 infection and should not be used as the sole basis for treatment or other patient management decisions. A negative result may occur with  improper specimen collection/handling, submission of specimen other than nasopharyngeal swab, presence of viral mutation(s) within the areas targeted by this assay, and inadequate number of viral copies(<138 copies/mL). A negative result must be combined with clinical observations, patient history, and epidemiological information. The expected result is Negative.  Fact Sheet for Patients:  BloggerCourse.com  Fact Sheet for Healthcare Providers:  SeriousBroker.it  This test is no t yet approved or cleared by the [place] FDA and  has been authorized for detection and/or diagnosis of SARS-CoV-2 by FDA under an Emergency Use Authorization (EUA). This EUA will remain  in effect (meaning this test can be used) for the duration of the COVID-19 declaration under Section 564(b)(1) of the Act, 21 U.S.C.section 360bbb-3(b)(1), unless the authorization is terminated  or revoked sooner.       Influenza A by PCR NEGATIVE NEGATIVE Final   Influenza B by PCR NEGATIVE NEGATIVE Final    Comment: (NOTE) The Xpert Xpress SARS-CoV-2/FLU/RSV plus assay is intended as an aid in the diagnosis of influenza from Nasopharyngeal swab specimens and should not be used as a sole basis for treatment. Nasal washings  and aspirates are unacceptable for Xpert Xpress SARS-CoV-2/FLU/RSV testing.  Fact Sheet for Patients: BloggerCourse.com  Fact Sheet for Healthcare Providers: SeriousBroker.it  This test is not yet approved or cleared by the [place] FDA and has been authorized for detection and/or diagnosis of SARS-CoV-2 by FDA under an Emergency Use Authorization (EUA). This EUA will remain in effect (meaning this test can be used) for the duration of the COVID-19 declaration under Section 564(b)(1) of the Act, 21 U.S.C. section 360bbb-3(b)(1), unless the  authorization is terminated or revoked.  Performed at [facility], [address] [place], [place] [address]      Time coordinating discharge: 40 minutes  SIGNED:   Alba Cory, MD  [facility]

## 2021-05-19 DIAGNOSIS — C799 Secondary malignant neoplasm of unspecified site: Secondary | ICD-10-CM | POA: Diagnosis not present

## 2021-05-19 DIAGNOSIS — K5900 Constipation, unspecified: Secondary | ICD-10-CM | POA: Diagnosis not present

## 2021-05-19 DIAGNOSIS — R600 Localized edema: Secondary | ICD-10-CM | POA: Diagnosis not present

## 2021-05-19 DIAGNOSIS — S22069S Unspecified fracture of T7-T8 vertebra, sequela: Secondary | ICD-10-CM | POA: Diagnosis not present

## 2021-05-19 DIAGNOSIS — I10 Essential (primary) hypertension: Secondary | ICD-10-CM | POA: Diagnosis not present

## 2021-05-19 DIAGNOSIS — R339 Retention of urine, unspecified: Secondary | ICD-10-CM | POA: Diagnosis not present

## 2021-05-19 DIAGNOSIS — N390 Urinary tract infection, site not specified: Secondary | ICD-10-CM | POA: Diagnosis not present

## 2021-05-19 DIAGNOSIS — D649 Anemia, unspecified: Secondary | ICD-10-CM | POA: Diagnosis not present

## 2021-05-19 DIAGNOSIS — M6281 Muscle weakness (generalized): Secondary | ICD-10-CM | POA: Diagnosis not present

## 2021-05-20 NOTE — Progress Notes (Signed)
?Radiation Oncology         [phone] ?________________________________ ? ?Name: Martin Foster MRN: [id number]  ?Date: 05/13/2021  DOB: 1925-08-06 ? ?Post Treatment Note ? ?CC: [name], MD  Ardis Hughs, MD ? ?Diagnosis:   88 yo man at risk for spinal cord compression at T7 from metastatic castration resistant prostate cancer weeks  ? ?Interval Since Last Radiation:  4 weeks ?04/08/21- 04/14/21:   The target at T7 was treated to 30 Gy in 10 fractions of 3 Gy ? ?Narrative:  I spoke with the patient's wife to conduct his routine scheduled 1 month follow up visit via telephone to spare the patient unnecessary potential exposure in the healthcare setting during the current COVID-19 pandemic.  The patient was notified in advance and gave permission to proceed with this visit format. ? ?He tolerated radiation treatment relatively well without any ill side effects. He did report modest fatigue and mild improvement in his back pain towards the end of treatment.                             ? ?On review of systems, the patient states that he is no longer having any back pain and is pleased with his progress to date.  He denies any ill side effects associated with radiation. ? ?ALLERGIES:  is allergic to lisinopril, olmesartan medoxomil-hctz, ampicillin, aspartame and phenylalanine, ciprofibrate, ciprofloxacin, erythromycin, and fd&c yellow #6 [yellow dye]. ? ?Meds: ?Current Outpatient Medications  ?Medication Sig Dispense Refill  ? Ascorbic Acid (VITAMIN C) 500 MG CHEW Chew 500 mg by mouth daily.    ? cephALEXin (KEFLEX) 500 MG capsule Take 1 capsule (500 mg total) by mouth 3 (three) times daily for 3 days. 9 capsule 0  ? cholecalciferol (VITAMIN D3) 25 MCG (1000 UNIT) tablet Take 1,000 Units by mouth daily.    ? dexamethasone (DECADRON) 4 MG tablet Take 1 tablet (4 mg total) by mouth 3 (three) times daily. 30 tablet 0  ? ferrous sulfate 325 (65 FE) MG tablet Take 325 mg by mouth daily.    ? Leuprolide Acetate  (LUPRON [place]) Inject 1 Dose as directed every 6 (six) months.    ? losartan (COZAAR) 100 MG tablet Take 100 mg by mouth daily.    ? Misc Natural Products (OSTEO BI-FLEX ADV DOUBLE ST PO) Take 1 tablet by mouth daily.    ? Multiple Vitamin (MULTIVITAMIN WITH MINERALS) TABS tablet Take 1 tablet by mouth daily.    ? oxyCODONE (OXY IR/ROXICODONE) 5 MG immediate release tablet Take 1-2 tablets (5-10 mg total) by mouth every 6 (six) hours. 30 tablet 0  ? polyethylene glycol (MIRALAX / GLYCOLAX) 17 g packet Take 17 g by mouth daily. 14 each 0  ? senna-docusate (SENOKOT-S) 8.6-50 MG tablet Take 1 tablet by mouth 2 (two) times daily. 30 tablet 0  ? Tamsulosin HCl (FLOMAX) 0.4 MG CAPS Take 0.4 mg by mouth daily after supper.    ? vitamin B-12 1000 MCG tablet Take 1 tablet (1,000 mcg total) by mouth daily. 30 tablet 0  ? vitamin E 180 MG (400 UNITS) capsule Take 400 Units by mouth daily.    ? ?No current facility-administered medications for this encounter.  ? ? ?Physical Findings: ? vitals were not taken for this visit.  ?Pain Assessment ?Pain Score: 8  (Overall joint pain)/10 ?Unable to assess due to telephone follow-up visit format. ? ?Lab Findings: ?Lab Results  ?Component  Value Date  ? WBC 12.7 (H) 05/16/2021  ? HGB 10.4 (L) 05/16/2021  ? HCT 31.4 (L) 05/16/2021  ? MCV 92.4 05/16/2021  ? PLT 381 05/16/2021  ? ? ? ?Radiographic Findings: ?CT Thoracic Spine Wo Contrast ? ?Result Date: 05/13/2021 ?CLINICAL DATA:  Back trauma, no prior imaging (Age >= 16y) EXAM: CT THORACIC SPINE WITHOUT CONTRAST TECHNIQUE: Multidetector CT images of the thoracic were obtained using the standard protocol without intravenous contrast. RADIATION DOSE REDUCTION: This exam was performed according to the departmental dose-optimization program which includes automated exposure control, adjustment of the mA and/or kV according to patient size and/or use of iterative reconstruction technique. COMPARISON:  CT 03/05/2020 FINDINGS: Alignment: Dextroconvex  curvature of the thoracic spine. No static listhesis. Vertebrae: There is osseous metastatic disease involving the right transverse process and pedicle of T1, the T3 vertebral body and posterior elements on the left, T7 and left posterior elements, T11 vertebral body, and T12. There is progressive height loss of T7 posteriorly in comparison to prior PET-CT. There is progressive height loss of the T7 vertebral body posteriorly, with up to 50% height loss, previously 25%. The T7 lesion encroaches upon the spinal canal and left neural foramen. Unchanged anterior height loss of T12. Other osseous metastatic lesions are similar. There is a new anterior compression fracture of T8, presumably pathologic, with up to 25% height loss. Paraspinal and other soft tissues: Small bilateral pleural effusions. Aortic atherosclerosis. Coronary artery calcifications. Disc levels: There is multilevel degenerative disc disease and facet arthropathy most severe the upper and lower thoracic spine and at T8-T9. IMPRESSION: Osseous metastatic disease in the thoracic spine with increased, progressive height loss of T7, now 50%, previously 25%. There is encroachment of the T7 metastatic lesion upon the spinal canal and left neural foramen. Other osseous metastatic lesions are similar to recent PET-CT. New anterior compression fracture of T8, presumably pathologic, with up to 25% height loss. Electronically Signed   By: Maurine Simmering M.D.   On: 05/13/2021 17:41  ? ?CT Lumbar Spine Wo Contrast ? ?Result Date: 05/13/2021 ?CLINICAL DATA:  Back trauma, no prior imaging (Age >= 16y); fall EXAM: CT LUMBAR SPINE WITHOUT CONTRAST TECHNIQUE: Multidetector CT imaging of the lumbar spine was performed without intravenous contrast administration. Multiplanar CT image reconstructions were also generated. RADIATION DOSE REDUCTION: This exam was performed according to the departmental dose-optimization program which includes automated exposure control,  adjustment of the mA and/or kV according to patient size and/or use of iterative reconstruction technique. COMPARISON:  Correlation made with CT abdomen 04/03/2021 FINDINGS: Segmentation: 5 lumbar type vertebrae. Alignment: Stable.  Levocurvature and mild degenerative listhesis. Vertebrae: Stable vertebral body heights. No acute fracture identified. Multilevel degenerative endplate irregularity. Known metastatic disease at T12. Decreased osseous mineralization. Paraspinal and other soft tissues: No acute abnormality. Disc levels: Advanced multilevel degenerative changes with disc space narrowing, disc bulges and endplate osteophytes, facet hypertrophy, and ligamentum flavum thickening. Canal stenosis is greatest at L3-L4. There is multilevel significant foraminal narrowing. IMPRESSION: No acute lumbar spine fracture. Electronically Signed   By: Macy Mis M.D.   On: 05/13/2021 14:13  ? ?MR TOTAL SPINE METS SCREENING ? ?Result Date: 05/14/2021 ?CLINICAL DATA:  Metastatic prostate carcinoma EXAM: MRI TOTAL SPINE WITHOUT AND WITH CONTRAST TECHNIQUE: Multisequence MR imaging of the spine from the cervical spine to the sacrum was performed prior to and following IV contrast administration for evaluation of spinal metastatic disease. CONTRAST:  57m GADAVIST GADOBUTROL 1 MMOL/ML IV SOLN COMPARISON:  None Available.  FINDINGS: Alignment: Physiologic. Vertebrae: There are osseous metastases at T3, T7, T8, T11 and T12. contrast enhancing lesions extend into the right pedicles of T3 and T8 in cause moderate attenuation of the thecal sac. There is increased contrast enhancement along the dorsal aspect of T12. Cord: Normal signal and morphology. Paraspinal tissues: Small pleural effusions. Otherwise limited visualization. Disc levels: Limited assessment of the disc levels due to motion degraded axial imaging. There is at least moderate narrowing of the spinal canal at T12-L1 and mild spinal canal stenosis at L1-2 and L2-3.  There is multilevel severe foraminal stenosis at the lower lumbar levels. IMPRESSION: 1. Motion degraded study. 2. Osseous metastases at T3, T7, T8, T11 and T12. 3. Moderate spinal canal stenosis at T3-4 and T8 due

## 2021-05-20 NOTE — Progress Notes (Addendum)
Opened in error

## 2021-05-21 DIAGNOSIS — S32000A Wedge compression fracture of unspecified lumbar vertebra, initial encounter for closed fracture: Secondary | ICD-10-CM | POA: Diagnosis not present

## 2021-05-21 DIAGNOSIS — R531 Weakness: Secondary | ICD-10-CM | POA: Diagnosis not present

## 2021-05-21 DIAGNOSIS — C7982 Secondary malignant neoplasm of genital organs: Secondary | ICD-10-CM | POA: Diagnosis not present

## 2021-05-21 DIAGNOSIS — E43 Unspecified severe protein-calorie malnutrition: Secondary | ICD-10-CM | POA: Diagnosis not present

## 2021-05-24 DIAGNOSIS — I10 Essential (primary) hypertension: Secondary | ICD-10-CM | POA: Diagnosis not present

## 2021-05-24 DIAGNOSIS — R339 Retention of urine, unspecified: Secondary | ICD-10-CM | POA: Diagnosis not present

## 2021-05-24 DIAGNOSIS — S22069S Unspecified fracture of T7-T8 vertebra, sequela: Secondary | ICD-10-CM | POA: Diagnosis not present

## 2021-05-24 DIAGNOSIS — M6281 Muscle weakness (generalized): Secondary | ICD-10-CM | POA: Diagnosis not present

## 2021-05-25 ENCOUNTER — Encounter: Payer: Medicare Other | Admitting: Family Medicine

## 2021-05-26 ENCOUNTER — Encounter: Payer: Medicare Other | Admitting: Family Medicine

## 2021-05-26 ENCOUNTER — Telehealth: Payer: Self-pay | Admitting: Family Medicine

## 2021-05-26 NOTE — Telephone Encounter (Signed)
Went to visit pt in SNF and he said he was finally feeling a little better, trying to eat dinner.  Is very HOH and requests that provider talk with his daughter in law. Daughter in law had company and requested to reschedule for tomorrow.  Nursing facility stated that pt vomited while in rehab therapy today.  Notes in Epic indicate rectal cancer, prostate cancer(castration resistant) with mets to bone and liver.  Will follow up tomorrow.  Damaris Hippo FNP-C ?

## 2021-05-27 ENCOUNTER — Encounter: Payer: Self-pay | Admitting: Family Medicine

## 2021-05-28 ENCOUNTER — Encounter: Payer: Self-pay | Admitting: Family Medicine

## 2021-05-28 ENCOUNTER — Non-Acute Institutional Stay: Payer: Self-pay | Admitting: Family Medicine

## 2021-05-28 VITALS — BP 132/62 | HR 79 | Resp 18

## 2021-05-28 DIAGNOSIS — C61 Malignant neoplasm of prostate: Secondary | ICD-10-CM

## 2021-05-28 DIAGNOSIS — Z515 Encounter for palliative care: Secondary | ICD-10-CM

## 2021-05-28 NOTE — Progress Notes (Signed)
[facility] Pall[facility] Consult Note Telephone: [phone]  Fax: [phone]   Date of encounter: 05/28/21 12:02 PM PATIENT NAME: Martin Foster [address] [place] [address]   [phone] (home)  DOB: 09/04/1925 MRN: [id number] PRIMARY CARE PROVIDER:    [name], MD,  [place] [address] [place] [address] [phone]  REFERRING PROVIDER:   [name], MD [address],  [place] [address] [phone]  RESPONSIBLE PARTY:    Contact Information     Name Relation Home Work Mobile   Martin Foster Spouse [phone]     Martin Foster,Martin Foster Daughter   [phone]   Hennie Duos   [phone]        I met face to face with patient and daughter, Martin Foster in  the skilled nursing facility. Palliative Care was asked to follow this patient by consultation request of  [name], MD to address advance care planning and complex medical decision making. This is the initial visit and pt/daughter declined Palliative Service at the present time.         ASSESSMENT, SYMPTOM MANAGEMENT AND PLAN / RECOMMENDATIONS:   Palliative Care Encounter Advised of services offered by Palliative Care Family believes he needs more but not wanting to advance with Hospice Care at present. Has good pain relief with current regimen and Senna for relief of constipation.   Prostate cancer metastatic to bone Has hx of rectal cancer and being treated with oral chemo-Zytiga and Xeloda. Prostate cancer is castration resistant and has received radiation therapy to T7 lesion. On Oxy IR 5-10 mg q6 hrs, also on Decadron.  Follow up Palliative Care Visit: If patient/family requests. Declined Palliative Service admission.   This visit was coded based on medical decision making (MDM).  PPS: 40%  HOSPICE ELIGIBILITY/DIAGNOSIS: TBD  Chief Complaint:  Martin Foster  received a referral to follow up with patient for chronic disease management in setting of metastatic prostate cancer and rectal cancer.   Pallitive Care is also following to assist with advance directive planning and defining/refining goals of care.  HISTORY OF PRESENT ILLNESS:  Martin Foster is a 88 y.o. year old male with hx of malignant melanoma/rectal cancer with bleeding and prostate cancer metastatic to bone (spinal canal-T7). Osseous metastases on MRI at T3, T7, T8, T11 and T12. Moderate spinal canal stenosis at T3-4 and T8 due to metastatic disease. He has received 5 radiation treatments to spine and a course of IV Decadron.  He also has aortic atherosclerosis, HTN, sensorineural hearing loss, lumbar spondylosis, indwelling catheter since 06/26/11, recent UTI in last 2 weeks "with multiple species" treated with IV Rocephin and d/c'd on oral Keflex, anemia, HLD, hyperglycemia, vitamin D deficiency, T8 compression fracture and weakness.  Foley catheter was replaced 05/15/21. He states good relief of back pain with current pain regimen.  Had some nausea/vomiting a couple of days ago, nursing states none today. He is incontinent of bowel and bladder, denies falls, has to have assist with bathing and dressing.  Has been unable to ambulate.  Advised of role of Palliative Care in management of symptoms impacting quality of life.  Daughter states he may not have a month.  Advised that I could discuss potential Hospice referral.  She states they had a prior bad experience with Hospice though not [facility] and indicated this was 30 years ago.  She states "our impression is that they like to starve people to death."  Advised that as our metabolic processes slow down and we approach closer to the end of life that there are much less nutritional needs.  Advised that Hospice does not deny food but will help if someone needs help with feeding but most of the time it is the patient's personal choice not to eat  and that generally hunger cues are not present like they are normally.  Advised of bereavement counseling for 13 months after passing.  She states that pt is being d/c'd from SNF as of Monday for non-covered days and she is not sure if pt will remain on assisted level or where they will go.  She wanted to know if they were managing his pain well what benefit Hospice would provide. Explained that there is symptom management for pain, nausea, SOB, constipation, emotional support, spiritual support.  She says that at the present time he has what he needs but she did take Palliative brochure, was aware that he could remain with Palliative also but she currently declined.  She was informed that they could self refer to Hospice or call back Palliative number and we could help with that transition.  Patient expressed strong spiritual belief in God and that he would be ok.  He thanked provider for coming by.  History obtained from review of EMR, discussion with family, facility staff and/or Martin Foster.  I reviewed available labs, medications, imaging, studies and related documents from the EMR.  Records reviewed and summarized above.   ROS General: NAD EYES: denies vision changes ENMT: denies dysphagia Cardiovascular: denies chest pain, denies DOE Pulmonary: denies cough, denies increased SOB Abdomen: endorses good appetite, denies constipation, endorses incontinence of bowel GU: denies dysuria, incontinent of urine with urinary retention MSK:  endorses increased weakness, current inability to ambulate, no falls reported Skin: denies rashes or wounds Neurological: denies pain with current pain management, denies insomnia Psych: Endorses positive mood Heme/lymph/immuno: denies bruises, abnormal bleeding  Physical Exam: Current and past weights: 143 lbs 11.8 ounces as of 05/13/21, prior weight 04/03/21 was 154 lbs. Constitutional: NAD General: frail appearing EYES: anicteric sclera, lids intact, no  discharge  ENMT:  very hard of hearing (best hearing left ear), oral mucous membranes moist, missing some dentition CV: S1S2, RRR, no LE edema Pulmonary: CTAB, no increased work of breathing, no cough, room air Abdomen: normo-active BS + 4 quadrants, soft and non tender, no ascites GU: deferred MSK: no sarcopenia, moves all extremities, bedbound presently Skin: warm and dry, no rashes or wounds on visible skin Neuro:  no generalized weakness, no cognitive impairment Psych: non-anxious affect, A and O x 3 Hem/lymph/immuno: no widespread bruising  CURRENT PROBLEM LIST:  Patient Active Problem List   Diagnosis Date Noted   UTI (urinary tract infection) 05/13/2021   Weakness    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) 04/05/2021   Anemia 04/05/2021   Disorder of the skin and subcutaneous tissue, unspecified 04/05/2021   Hyperglycemia 04/05/2021   Hyperlipidemia 04/05/2021   Low back pain 04/05/2021   Lumbar spondylosis 04/05/2021   Malignant melanoma of skin ([place]) 08/23/4816   Metabolic syndrome 56/31/4970   Nodular prostate without lower urinary tract symptoms 04/05/2021   Prediabetes 04/05/2021   Rectal bleeding 04/05/2021   Senile purpura ([place]) 04/05/2021   Sensorineural hearing loss 04/05/2021   Vitamin D deficiency 04/05/2021   Adenocarcinoma of rectum ([place]) 04/08/2020   Prostate cancer metastatic to bone ([place]) 04/08/2020   Hypertension 11/13/2019   Acute appendicitis 11/11/2019   Atherosclerosis  of aorta ([place]) 11/11/2019   Rectal mass 11/11/2019   PAST MEDICAL HISTORY:  Active Ambulatory Problems    Diagnosis Date Noted   Adenocarcinoma of rectum ([place]) 04/08/2020   Prostate cancer metastatic to bone ([place]) 04/08/2020   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) 04/05/2021   Acute appendicitis 11/11/2019   Anemia 04/05/2021   Atherosclerosis of aorta ([place]) 11/11/2019   Disorder of the skin and subcutaneous tissue, unspecified 04/05/2021   Hyperglycemia  04/05/2021   Hypertension 11/13/2019   Hyperlipidemia 04/05/2021   Low back pain 04/05/2021   Lumbar spondylosis 04/05/2021   Malignant melanoma of skin ([place]) 76/72/0947   Metabolic syndrome 09/62/8366   Nodular prostate without lower urinary tract symptoms 04/05/2021   Prediabetes 04/05/2021   Rectal bleeding 04/05/2021   Rectal mass 11/11/2019   Senile purpura ([place]) 04/05/2021   Sensorineural hearing loss 04/05/2021   Vitamin D deficiency 04/05/2021   UTI (urinary tract infection) 05/13/2021   Weakness    Resolved Ambulatory Problems    Diagnosis Date Noted   No Resolved Ambulatory Problems   Past Medical History:  Diagnosis Date   Prostate cancer ([place])    Rectal cancer ([place]) 01/09/2020   Renal disorder    SOCIAL HX:  Social History   Tobacco Use   Smoking status: Former   Smokeless tobacco: Never  Substance Use Topics   Alcohol use: No   FAMILY HX:  Family History  Problem Relation Age of Onset   Gastric cancer Mother    Prostate cancer Father    Colon cancer Father        Preferred Pharmacy: ALLERGIES:  Allergies  Allergen Reactions   Lisinopril Swelling    Facial swelling   Olmesartan Medoxomil-Hctz Swelling    Facial swelling   Ampicillin Swelling    Facial swelling   Aspartame And Phenylalanine Swelling    Facial swelling   Ciprofibrate Swelling    Facial swelling   Ciprofloxacin Swelling    Facial and leg swelling   Erythromycin Swelling    Facial swelling   Fd&C Yellow #6 [Yellow Dye] Other (See Comments)    Unknown reaction     PERTINENT MEDICATIONS:  Outpatient Encounter Medications as of 05/28/2021  Medication Sig   Ascorbic Acid (VITAMIN C) 500 MG CHEW Chew 500 mg by mouth daily.   cholecalciferol (VITAMIN D3) 25 MCG (1000 UNIT) tablet Take 1,000 Units by mouth daily.   dexamethasone (DECADRON) 4 MG tablet Take 1 tablet (4 mg total) by mouth 3 (three) times daily.   ferrous sulfate 325 (65 FE) MG tablet Take 325 mg by mouth daily.    Leuprolide Acetate (LUPRON [place]) Inject 1 Dose as directed every 6 (six) months.   losartan (COZAAR) 100 MG tablet Take 100 mg by mouth daily.   Misc Natural Products (OSTEO BI-FLEX ADV DOUBLE ST PO) Take 1 tablet by mouth daily.   Multiple Vitamin (MULTIVITAMIN WITH MINERALS) TABS tablet Take 1 tablet by mouth daily.   oxyCODONE (OXY IR/ROXICODONE) 5 MG immediate release tablet Take 1-2 tablets (5-10 mg total) by mouth every 6 (six) hours.   polyethylene glycol (MIRALAX / GLYCOLAX) 17 g packet Take 17 g by mouth daily.   senna-docusate (SENOKOT-S) 8.6-50 MG tablet Take 1 tablet by mouth 2 (two) times daily.   Tamsulosin HCl (FLOMAX) 0.4 MG CAPS Take 0.4 mg by mouth daily after supper.   vitamin B-12 1000 MCG tablet Take 1 tablet (1,000 mcg total) by mouth daily.   vitamin E 180  MG (400 UNITS) capsule Take 400 Units by mouth daily.   No facility-administered encounter medications on file as of 05/28/2021.     -------------------------------------------------------------------------------------------------------------------------------------------------------------------------------------------------------------------------------------------- Advance Care Planning/Goals of Care: Goals include to maximize quality of life and symptom management.  CODE STATUS: Last code status listed as DNR/DNI with comfort measures but no paperwork noted   Thank you for the opportunity to participate in the care of Mr. Kiener.  The palliative care team will continue to follow. Please call our office at [phone] if we can be of additional assistance.   Marijo Conception, FNP-C  COVID-19 PATIENT SCREENING TOOL Asked and negative response unless otherwise noted:  Have you had symptoms of covid, tested positive or been in contact with someone with symptoms/positive test in the past 5-10 days? unknown

## 2021-05-31 DIAGNOSIS — Z515 Encounter for palliative care: Secondary | ICD-10-CM | POA: Insufficient documentation

## 2021-06-01 DIAGNOSIS — R339 Retention of urine, unspecified: Secondary | ICD-10-CM | POA: Diagnosis not present

## 2021-06-01 DIAGNOSIS — M6281 Muscle weakness (generalized): Secondary | ICD-10-CM | POA: Diagnosis not present

## 2021-06-01 DIAGNOSIS — S22069S Unspecified fracture of T7-T8 vertebra, sequela: Secondary | ICD-10-CM | POA: Diagnosis not present

## 2021-06-01 DIAGNOSIS — I10 Essential (primary) hypertension: Secondary | ICD-10-CM | POA: Diagnosis not present

## 2021-06-05 DIAGNOSIS — C799 Secondary malignant neoplasm of unspecified site: Secondary | ICD-10-CM | POA: Diagnosis not present

## 2021-06-05 DIAGNOSIS — K5900 Constipation, unspecified: Secondary | ICD-10-CM | POA: Diagnosis not present

## 2021-06-05 DIAGNOSIS — I10 Essential (primary) hypertension: Secondary | ICD-10-CM | POA: Diagnosis not present

## 2021-06-05 DIAGNOSIS — S22069S Unspecified fracture of T7-T8 vertebra, sequela: Secondary | ICD-10-CM | POA: Diagnosis not present

## 2021-06-05 DIAGNOSIS — M6281 Muscle weakness (generalized): Secondary | ICD-10-CM | POA: Diagnosis not present

## 2021-06-05 DIAGNOSIS — R600 Localized edema: Secondary | ICD-10-CM | POA: Diagnosis not present

## 2021-06-10 DIAGNOSIS — R339 Retention of urine, unspecified: Secondary | ICD-10-CM | POA: Diagnosis not present

## 2021-06-10 DIAGNOSIS — E559 Vitamin D deficiency, unspecified: Secondary | ICD-10-CM | POA: Diagnosis not present

## 2021-06-10 DIAGNOSIS — S22069S Unspecified fracture of T7-T8 vertebra, sequela: Secondary | ICD-10-CM | POA: Diagnosis not present

## 2021-06-10 DIAGNOSIS — B370 Candidal stomatitis: Secondary | ICD-10-CM | POA: Diagnosis not present

## 2021-06-10 DIAGNOSIS — I10 Essential (primary) hypertension: Secondary | ICD-10-CM | POA: Diagnosis not present

## 2021-06-16 DIAGNOSIS — S22069S Unspecified fracture of T7-T8 vertebra, sequela: Secondary | ICD-10-CM | POA: Diagnosis not present

## 2021-06-16 DIAGNOSIS — R339 Retention of urine, unspecified: Secondary | ICD-10-CM | POA: Diagnosis not present

## 2021-06-16 DIAGNOSIS — I10 Essential (primary) hypertension: Secondary | ICD-10-CM | POA: Diagnosis not present

## 2021-06-16 DIAGNOSIS — B370 Candidal stomatitis: Secondary | ICD-10-CM | POA: Diagnosis not present

## 2021-06-25 DIAGNOSIS — C7982 Secondary malignant neoplasm of genital organs: Secondary | ICD-10-CM | POA: Diagnosis not present

## 2021-06-25 DIAGNOSIS — R531 Weakness: Secondary | ICD-10-CM | POA: Diagnosis not present

## 2021-06-25 DIAGNOSIS — E43 Unspecified severe protein-calorie malnutrition: Secondary | ICD-10-CM | POA: Diagnosis not present

## 2021-06-25 DIAGNOSIS — S32000A Wedge compression fracture of unspecified lumbar vertebra, initial encounter for closed fracture: Secondary | ICD-10-CM | POA: Diagnosis not present

## 2021-06-28 DIAGNOSIS — S22069S Unspecified fracture of T7-T8 vertebra, sequela: Secondary | ICD-10-CM | POA: Diagnosis not present

## 2021-06-28 DIAGNOSIS — B370 Candidal stomatitis: Secondary | ICD-10-CM | POA: Diagnosis not present

## 2021-06-28 DIAGNOSIS — I10 Essential (primary) hypertension: Secondary | ICD-10-CM | POA: Diagnosis not present

## 2021-06-28 DIAGNOSIS — R339 Retention of urine, unspecified: Secondary | ICD-10-CM | POA: Diagnosis not present

## 2021-07-01 DIAGNOSIS — S22060D Wedge compression fracture of T7-T8 vertebra, subsequent encounter for fracture with routine healing: Secondary | ICD-10-CM | POA: Diagnosis not present

## 2021-07-01 DIAGNOSIS — N3000 Acute cystitis without hematuria: Secondary | ICD-10-CM | POA: Diagnosis not present

## 2021-07-01 DIAGNOSIS — R262 Difficulty in walking, not elsewhere classified: Secondary | ICD-10-CM | POA: Diagnosis not present

## 2021-07-01 DIAGNOSIS — R339 Retention of urine, unspecified: Secondary | ICD-10-CM | POA: Diagnosis not present

## 2021-07-01 DIAGNOSIS — C7982 Secondary malignant neoplasm of genital organs: Secondary | ICD-10-CM | POA: Diagnosis not present

## 2021-07-01 DIAGNOSIS — N139 Obstructive and reflux uropathy, unspecified: Secondary | ICD-10-CM | POA: Diagnosis not present

## 2021-07-01 DIAGNOSIS — C7951 Secondary malignant neoplasm of bone: Secondary | ICD-10-CM | POA: Diagnosis not present

## 2021-07-01 DIAGNOSIS — R2681 Unsteadiness on feet: Secondary | ICD-10-CM | POA: Diagnosis not present

## 2021-07-01 DIAGNOSIS — R278 Other lack of coordination: Secondary | ICD-10-CM | POA: Diagnosis not present

## 2021-07-01 DIAGNOSIS — I10 Essential (primary) hypertension: Secondary | ICD-10-CM | POA: Diagnosis not present

## 2021-07-01 DIAGNOSIS — R293 Abnormal posture: Secondary | ICD-10-CM | POA: Diagnosis not present

## 2021-07-01 DIAGNOSIS — M6281 Muscle weakness (generalized): Secondary | ICD-10-CM | POA: Diagnosis not present

## 2021-07-02 ENCOUNTER — Other Ambulatory Visit: Payer: Self-pay | Admitting: *Deleted

## 2021-07-02 NOTE — Patient Outreach (Signed)
Per [facility] eligible member currently resides in [facility].  Screened for potential [facility] [place] [facility] care coordination/care management services as a benefit of member's insurance plan.  Per [facility] [name] is under private pay at [facility].   No identifiable [facility] care coordination/care management needs.    Raiford Noble, MSN, RN,BSN [facility] Post Acute Care Coordinator [phone] [facility]) [phone]  (Toll free office)

## 2021-07-06 DIAGNOSIS — B370 Candidal stomatitis: Secondary | ICD-10-CM | POA: Diagnosis not present

## 2021-07-06 DIAGNOSIS — C799 Secondary malignant neoplasm of unspecified site: Secondary | ICD-10-CM | POA: Diagnosis not present

## 2021-07-06 DIAGNOSIS — S22069S Unspecified fracture of T7-T8 vertebra, sequela: Secondary | ICD-10-CM | POA: Diagnosis not present

## 2021-07-06 DIAGNOSIS — I10 Essential (primary) hypertension: Secondary | ICD-10-CM | POA: Diagnosis not present

## 2021-07-06 DIAGNOSIS — R339 Retention of urine, unspecified: Secondary | ICD-10-CM | POA: Diagnosis not present

## 2021-07-09 ENCOUNTER — Emergency Department (HOSPITAL_COMMUNITY)
Admission: EM | Admit: 2021-07-09 | Discharge: 2021-07-09 | Disposition: A | Discharge status: Home / Self Care | Payer: Medicare Other | Attending: Emergency Medicine | Admitting: Emergency Medicine

## 2021-07-09 DIAGNOSIS — M2550 Pain in unspecified joint: Secondary | ICD-10-CM | POA: Diagnosis not present

## 2021-07-09 DIAGNOSIS — Z0000 Encounter for general adult medical examination without abnormal findings: Secondary | ICD-10-CM | POA: Diagnosis present

## 2021-07-09 DIAGNOSIS — Z8546 Personal history of malignant neoplasm of prostate: Secondary | ICD-10-CM | POA: Diagnosis not present

## 2021-07-09 DIAGNOSIS — Z743 Need for continuous supervision: Secondary | ICD-10-CM | POA: Diagnosis not present

## 2021-07-09 DIAGNOSIS — N390 Urinary tract infection, site not specified: Secondary | ICD-10-CM

## 2021-07-09 DIAGNOSIS — Z79899 Other long term (current) drug therapy: Secondary | ICD-10-CM | POA: Insufficient documentation

## 2021-07-09 DIAGNOSIS — Z0001 Encounter for general adult medical examination with abnormal findings: Secondary | ICD-10-CM | POA: Diagnosis not present

## 2021-07-09 DIAGNOSIS — Z7401 Bed confinement status: Secondary | ICD-10-CM | POA: Diagnosis not present

## 2021-07-09 DIAGNOSIS — R6889 Other general symptoms and signs: Secondary | ICD-10-CM | POA: Diagnosis not present

## 2021-07-09 DIAGNOSIS — R8289 Other abnormal findings on cytological and histological examination of urine: Secondary | ICD-10-CM | POA: Diagnosis not present

## 2021-07-09 LAB — COMPREHENSIVE METABOLIC PANEL
ALT: 34 U/L (ref 0–44)
AST: 22 U/L (ref 15–41)
Albumin: 2.4 g/dL — ABNORMAL LOW (ref 3.5–5.0)
Alkaline Phosphatase: 63 U/L (ref 38–126)
Anion gap: 9 (ref 5–15)
BUN: 35 mg/dL — ABNORMAL HIGH (ref 8–23)
CO2: 22 mmol/L (ref 22–32)
Calcium: 8.6 mg/dL — ABNORMAL LOW (ref 8.9–10.3)
Chloride: 105 mmol/L (ref 98–111)
Creatinine, Ser: 0.91 mg/dL (ref 0.61–1.24)
GFR, Estimated: >60 mL/min (ref >60)
Glucose, Bld: 246 mg/dL — ABNORMAL HIGH (ref 70–99)
Potassium: 4.3 mmol/L (ref 3.5–5.1)
Sodium: 136 mmol/L (ref 135–145)
Total Bilirubin: 0.4 mg/dL (ref 0.3–1.2)
Total Protein: 5.5 g/dL — ABNORMAL LOW (ref 6.5–8.1)

## 2021-07-09 LAB — CBC WITH DIFFERENTIAL/PLATELET
Abs Immature Granulocytes: 0.77 10*3/uL — ABNORMAL HIGH (ref 0.00–0.07)
Basophils Absolute: 0.1 10*3/uL (ref 0.0–0.1)
Basophils Relative: 1 %
Eosinophils Absolute: 0.1 10*3/uL (ref 0.0–0.5)
Eosinophils Relative: 1 %
HCT: 33.5 % — ABNORMAL LOW (ref 39.0–52.0)
Hemoglobin: 11.1 g/dL — ABNORMAL LOW (ref 13.0–17.0)
Immature Granulocytes: 7 %
Lymphocytes Relative: 9 %
Lymphs Abs: 1 10*3/uL (ref 0.7–4.0)
MCH: 30.7 pg (ref 26.0–34.0)
MCHC: 33.1 g/dL (ref 30.0–36.0)
MCV: 92.8 fL (ref 80.0–100.0)
Monocytes Absolute: 0.5 10*3/uL (ref 0.1–1.0)
Monocytes Relative: 4 %
Neutro Abs: 8.6 10*3/uL — ABNORMAL HIGH (ref 1.7–7.7)
Neutrophils Relative %: 78 %
Platelet Morphology: NORMAL
Platelets: 158 10*3/uL (ref 150–400)
RBC: 3.61 MIL/uL — ABNORMAL LOW (ref 4.22–5.81)
RDW: 19.8 % — ABNORMAL HIGH (ref 11.5–15.5)
WBC: 10.9 10*3/uL — ABNORMAL HIGH (ref 4.0–10.5)
nRBC: 0.7 % — ABNORMAL HIGH (ref 0.0–0.2)

## 2021-07-09 LAB — URINALYSIS, ROUTINE W REFLEX MICROSCOPIC
Bilirubin Urine: NEGATIVE
Glucose, UA: NEGATIVE mg/dL
Hgb urine dipstick: NEGATIVE
Ketones, ur: NEGATIVE mg/dL
Nitrite: NEGATIVE
Protein, ur: 30 mg/dL — AB
Specific Gravity, Urine: 1.018 (ref 1.005–1.030)
WBC, UA: >50 WBC/hpf — ABNORMAL HIGH (ref 0–5)
pH: 6 (ref 5.0–8.0)

## 2021-07-09 MED ORDER — CEFDINIR 300 MG PO CAPS: 300.0000 mg | ORAL_CAPSULE | Freq: Two times a day (BID) | ORAL | 0 refills | Status: DC

## 2021-07-09 NOTE — ED Provider Notes (Signed)
[facility] DEPT Provider Note   CSN: [id number] Arrival date & time: 07/09/21  1529     History  Chief Complaint  Patient presents with   Medical Clearance    Martin Foster is a 88 y.o. male.  88 year old male with history of metastatic prostate cancer to the bone who presents for his facility for evaluation of spread of his cancer.  Patient himself has no complaints.  States that he is not short of breath.  Denies any urinary symptoms.  States he does not have any pain anywhere.  Review of the patient's record shows that he recently had a palliative care consult but family deferred any intervention.       Home Medications Prior to Admission medications   Medication Sig Start Date End Date Taking? Authorizing Provider  Ascorbic Acid (VITAMIN C) 500 MG CHEW Chew 500 mg by mouth daily.    [provider]  cholecalciferol (VITAMIN D3) 25 MCG (1000 UNIT) tablet Take 1,000 Units by mouth daily.    [provider]  dexamethasone (DECADRON) 4 MG tablet Take 1 tablet (4 mg total) by mouth 3 (three) times daily. 05/18/21   Regalado, Belkys A, MD  ferrous sulfate 325 (65 FE) MG tablet Take 325 mg by mouth daily. 01/06/20   [provider]  Leuprolide Acetate (LUPRON [place]) Inject 1 Dose as directed every 6 (six) months.    [provider]  losartan (COZAAR) 100 MG tablet Take 100 mg by mouth daily. 03/14/20   [provider]  Misc Natural Products (OSTEO BI-FLEX ADV DOUBLE ST PO) Take 1 tablet by mouth daily.    [provider]  Multiple Vitamin (MULTIVITAMIN WITH MINERALS) TABS tablet Take 1 tablet by mouth daily.    [provider]  oxyCODONE (OXY IR/ROXICODONE) 5 MG immediate release tablet Take 1-2 tablets (5-10 mg total) by mouth every 6 (six) hours. 05/18/21   Regalado, Belkys A, MD  polyethylene glycol (MIRALAX / GLYCOLAX) 17 g packet Take 17 g by mouth daily. 05/18/21   Regalado, Belkys A, MD   senna-docusate (SENOKOT-S) 8.6-50 MG tablet Take 1 tablet by mouth 2 (two) times daily. 05/18/21   Regalado, Belkys A, MD  Tamsulosin HCl (FLOMAX) 0.4 MG CAPS Take 0.4 mg by mouth daily after supper.    [provider]  vitamin B-12 1000 MCG tablet Take 1 tablet (1,000 mcg total) by mouth daily. 05/18/21   Regalado, Belkys A, MD  vitamin E 180 MG (400 UNITS) capsule Take 400 Units by mouth daily.    [provider]      Allergies    Lisinopril, Olmesartan medoxomil-hctz, Ampicillin, Aspartame and phenylalanine, Ciprofibrate, Ciprofloxacin, Erythromycin, and Fd&c yellow #6 [yellow dye]    Review of Systems   Review of Systems  All other systems reviewed and are negative.   Physical Exam Updated Vital Signs BP 139/68 (BP Location: Right Arm)   Pulse 89   Temp 99.6 F (37.6 C) (Oral)   Resp 18   SpO2 93%  Physical Exam Vitals and nursing note reviewed.  Constitutional:      General: He is not in acute distress.    Appearance: Normal appearance. He is well-developed. He is not toxic-appearing.  HENT:     Head: Normocephalic and atraumatic.  Eyes:     General: Lids are normal.     Conjunctiva/sclera: Conjunctivae normal.     Pupils: Pupils are equal, round, and reactive to light.  Neck:  Thyroid: No thyroid mass.     Trachea: No tracheal deviation.  Cardiovascular:     Rate and Rhythm: Normal rate and regular rhythm.     Heart sounds: Normal heart sounds. No murmur heard.    No gallop.  Pulmonary:     Effort: Pulmonary effort is normal. No respiratory distress.     Breath sounds: Normal breath sounds. No stridor. No decreased breath sounds, wheezing, rhonchi or rales.  Abdominal:     General: There is no distension.     Palpations: Abdomen is soft.     Tenderness: There is no abdominal tenderness. There is no rebound.  Musculoskeletal:        General: No tenderness. Normal range of motion.     Cervical back: Normal range of motion and neck supple.   Skin:    General: Skin is warm and dry.     Findings: No abrasion or rash.  Neurological:     Mental Status: He is alert and oriented to person, place, and time. Mental status is at baseline.     GCS: GCS eye subscore is 4. GCS verbal subscore is 5. GCS motor subscore is 6.     Cranial Nerves: No cranial nerve deficit.     Sensory: No sensory deficit.     Motor: Motor function is intact.  Psychiatric:        Attention and Perception: Attention normal.        Speech: Speech normal.        Behavior: Behavior normal.     ED Results / Procedures / Treatments   Labs (all labs ordered are listed, but only abnormal results are displayed) Labs Reviewed  CBC WITH DIFFERENTIAL/PLATELET  COMPREHENSIVE METABOLIC PANEL  URINALYSIS, ROUTINE W REFLEX MICROSCOPIC    EKG None  Radiology No results found.  Procedures Procedures    Medications Ordered in ED Medications - No data to display  ED Course/ Medical Decision Making/ A&P                           Medical Decision Making Amount and/or Complexity of Data Reviewed Labs: ordered.   Patient had CBC and electrolytes as well as urinalysis.  Urinalysis does show some infection.  He is nontoxic-appearing.  He is afebrile.  Will place on antibiotics.  Discussed with family about sending patient back to [place] where he currently resides and they like to take him home.  Will discharge at this time        Final Clinical Impression(s) / ED Diagnoses Final diagnoses:  None    Rx / DC Orders ED Discharge Orders     None         Lacretia Leigh, MD 07/09/21 1916

## 2021-07-09 NOTE — ED Triage Notes (Signed)
[facility] brings pt in from [facility]. Pt has no complaints. Pt's wife wants pt evaluated since his cancer has spread.

## 2021-07-09 NOTE — ED Notes (Signed)
Pt. Saline lock removed and gauze applied, needle intact.

## 2021-07-11 ENCOUNTER — Other Ambulatory Visit: Payer: Self-pay | Admitting: Radiation Oncology

## 2021-07-11 MED ORDER — POLYETHYLENE GLYCOL 3350 17 G PO PACK
17.0000 g | PACK | Freq: Every day | ORAL | 0 refills | Status: DC
Start: 2021-07-11 — End: 2022-11-29

## 2021-07-11 MED ORDER — SENNOSIDES-DOCUSATE SODIUM 8.6-50 MG PO TABS: 1.0000 | ORAL_TABLET | Freq: Two times a day (BID) | ORAL | 0 refills | Status: DC

## 2021-07-11 MED ORDER — OXYCODONE HCL 5 MG PO TABS: 5.0000 mg | ORAL_TABLET | ORAL | 0 refills | Status: DC | PRN

## 2021-08-10 DEATH — deceased
